# Patient Record
Sex: Male | Born: 1980 | Race: White | Hispanic: No | Marital: Married | Smoking: Current every day smoker
Health system: Southern US, Community
[De-identification: ages and names within clinical notes are randomized; demographics above are authoritative.]

## PROBLEM LIST (undated history)

## (undated) DIAGNOSIS — B1920 Unspecified viral hepatitis C without hepatic coma: Secondary | ICD-10-CM

## (undated) HISTORY — PX: ORTHOPEDIC SURGERY: SHX850

---

## 2014-02-09 ENCOUNTER — Encounter (HOSPITAL_COMMUNITY): Payer: Self-pay | Admitting: Emergency Medicine

## 2014-02-09 ENCOUNTER — Emergency Department (HOSPITAL_COMMUNITY)
Admission: EM | Admit: 2014-02-09 | Discharge: 2014-02-10 | Disposition: A | Discharge status: Home / Self Care | Payer: Self-pay | Attending: Emergency Medicine | Admitting: Emergency Medicine

## 2014-02-09 DIAGNOSIS — S5010XA Contusion of unspecified forearm, initial encounter: Secondary | ICD-10-CM | POA: Insufficient documentation

## 2014-02-09 DIAGNOSIS — F17200 Nicotine dependence, unspecified, uncomplicated: Secondary | ICD-10-CM | POA: Insufficient documentation

## 2014-02-09 DIAGNOSIS — Y9289 Other specified places as the place of occurrence of the external cause: Secondary | ICD-10-CM | POA: Insufficient documentation

## 2014-02-09 DIAGNOSIS — W230XXA Caught, crushed, jammed, or pinched between moving objects, initial encounter: Secondary | ICD-10-CM | POA: Insufficient documentation

## 2014-02-09 DIAGNOSIS — Y93E9 Activity, other interior property and clothing maintenance: Secondary | ICD-10-CM | POA: Insufficient documentation

## 2014-02-09 NOTE — ED Notes (Signed)
Pt states he was moving and got his right arm twisted up between some furniture and a wall,  Pt has swelling to right forearm and elbow area.

## 2014-02-10 ENCOUNTER — Emergency Department (HOSPITAL_COMMUNITY): Payer: Self-pay

## 2014-02-10 MED ORDER — HYDROCODONE-ACETAMINOPHEN 5-325 MG PO TABS: 1.0000 | ORAL_TABLET | ORAL | Status: DC | PRN

## 2014-02-10 MED ORDER — IBUPROFEN 600 MG PO TABS: 600.0000 mg | ORAL_TABLET | Freq: Four times a day (QID) | ORAL | Status: DC | PRN

## 2014-02-10 MED ORDER — HYDROCODONE-ACETAMINOPHEN 5-325 MG PO TABS
1.0000 | ORAL_TABLET | Freq: Once | ORAL | Status: AC
  Administered 2014-02-10: 1 via ORAL
  Filled 2014-02-10: qty 1

## 2014-02-10 MED ORDER — IBUPROFEN 800 MG PO TABS
800.0000 mg | ORAL_TABLET | Freq: Once | ORAL | Status: AC
  Administered 2014-02-10: 800 mg via ORAL
  Filled 2014-02-10: qty 1

## 2014-02-10 NOTE — Discharge Instructions (Signed)
Contusion °A contusion is a deep bruise. Contusions are the result of an injury that caused bleeding under the skin. The contusion may turn blue, purple, or yellow. Minor injuries will give you a painless contusion, but more severe contusions may stay painful and swollen for a few weeks.  °CAUSES  °A contusion is usually caused by a blow, trauma, or direct force to an area of the body. °SYMPTOMS  °· Swelling and redness of the injured area. °· Bruising of the injured area. °· Tenderness and soreness of the injured area. °· Pain. °DIAGNOSIS  °The diagnosis can be made by taking a history and physical exam. An X-ray, CT scan, or MRI may be needed to determine if there were any associated injuries, such as fractures. °TREATMENT  °Specific treatment will depend on what area of the body was injured. In general, the best treatment for a contusion is resting, icing, elevating, and applying cold compresses to the injured area. Over-the-counter medicines may also be recommended for pain control. Ask your caregiver what the best treatment is for your contusion. °HOME CARE INSTRUCTIONS  °· Put ice on the injured area. °· Put ice in a plastic bag. °· Place a towel between your skin and the bag. °· Leave the ice on for 15-20 minutes, 03-04 times a day. °· Only take over-the-counter or prescription medicines for pain, discomfort, or fever as directed by your caregiver. Your caregiver may recommend avoiding anti-inflammatory medicines (aspirin, ibuprofen, and naproxen) for 48 hours because these medicines may increase bruising. °· Rest the injured area. °· If possible, elevate the injured area to reduce swelling. °SEEK IMMEDIATE MEDICAL CARE IF:  °· You have increased bruising or swelling. °· You have pain that is getting worse. °· Your swelling or pain is not relieved with medicines. °MAKE SURE YOU:  °· Understand these instructions. °· Will watch your condition. °· Will get help right away if you are not doing well or get  worse. °Document Released: 08/26/2005 Document Revised: 02/08/2012 Document Reviewed: 09/21/2011 °[employer]® Patient Information ©2014 [employer]. ° ° °You may take the hydrocodone prescribed for pain relief.  This will make you drowsy - do not drive within 4 hours of taking this medication. ° ° °

## 2014-02-10 NOTE — ED Notes (Signed)
Patient given ice pack to place on arm. Having pain at this time. States pain in elbow radiating towards bicep of arm.

## 2014-02-12 NOTE — ED Provider Notes (Signed)
CSN: [id number]     Arrival date & time 02/09/14  2341 History   First MD Initiated Contact with Patient 02/09/14 2354     No chief complaint on file.    (Consider location/radiation/quality/duration/timing/severity/associated sxs/prior Treatment) HPI Comments: Craig Terry is a 33 y.o. Male presenting with increasing right proximal forearm and elbow pain since an injury which occurred yesterday.  He describes helping move a heavy dresser around a corner yesterday and his right forearm was crushed between the furniture and the wall.  He had immediate pain after the event which was worse when he woke today and now has developed swelling at the site.  He denies radiation of pain into his upper arm or his wrist and hand.  He denies numbness or tingling in his fingers.  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  He has taken no medicines or treatments for his pain which is sharp, constant and worse with movement of the elbow and palpation.     The history is provided by the patient.    History reviewed. No pertinent past medical history. History reviewed. No pertinent past surgical history. No family history on file. History  Substance Use Topics  . Smoking status: Current Every Day Smoker  . Smokeless tobacco: Not on file  . Alcohol Use: No    Review of Systems  Constitutional: Negative for fever.  Musculoskeletal: Positive for arthralgias and joint swelling. Negative for myalgias.  Neurological: Negative for weakness and numbness.      Allergies  Review of patient's allergies indicates no known allergies.  Home Medications   Current Outpatient Rx  Name  Route  Sig  Dispense  Refill  . HYDROcodone-acetaminophen (NORCO/VICODIN) 5-325 MG per tablet   Oral   Take 1 tablet by mouth every 4 (four) hours as needed.   20 tablet   0   . ibuprofen (ADVIL,MOTRIN) 600 MG tablet   Oral   Take 1 tablet (600 mg  total) by mouth every 6 (six) hours as needed.   30 tablet   0    BP 132/67  Pulse 100  Resp 16  SpO2 97% Physical Exam  Constitutional: He appears well-developed and well-nourished.  HENT:  Head: Atraumatic.  Neck: Normal range of motion.  Cardiovascular:  Pulses equal bilaterally  Musculoskeletal: He exhibits tenderness.  ttp with edema and mild erythema right proximal lateral forearm.  Skin is intact and soft.  Pt is mildly uncomfortable with elbow and wrist flexion/extension,  Pronation and supination with FROM.  Radial pulse intact,  Equal grip strength.  No numbness distal to the injury site.  No ecchymosis.  Neurological: He is alert. He has normal strength. He displays normal reflexes. No sensory deficit.  Skin: Skin is warm and dry.  Psychiatric: He has a normal mood and affect.    ED Course  Procedures (including critical care time) Labs Review Labs Reviewed - No data to display Imaging Review No results found.   EKG Interpretation None      MDM   Final diagnoses:  Contusion, forearm    Patients labs and/or radiological studies were viewed and considered during the medical decision making and disposition process.  Pt with contusion secondary to crush injury to forearm.  He has no signs of compartment syndrome at this time.  He was instructed on sx which would prompt immediate return including worse pain or swelling, numbness in wrist/hand/fingers.  Pt understands plan.  He was encouraged ice/elevation.  Ibuprofen, hydrcodone prescribed. referral to Dr Romeo Apple if not improving over the next week,  Return here immediately for any worsened sx.     Burgess Amor, PA-C 02/12/14 1344

## 2014-02-15 NOTE — ED Provider Notes (Signed)
Medical screening examination/treatment/procedure(s) were performed by non-physician practitioner and as supervising physician I was immediately available for consultation/collaboration.   EKG Interpretation None       Derwood KaplanAnkit Allister Lessley, MD 02/15/14 [phone]

## 2014-03-21 ENCOUNTER — Encounter (HOSPITAL_COMMUNITY): Payer: Self-pay | Admitting: Emergency Medicine

## 2014-03-21 ENCOUNTER — Emergency Department (HOSPITAL_COMMUNITY)
Admission: EM | Admit: 2014-03-21 | Discharge: 2014-03-22 | Disposition: A | Discharge status: Home / Self Care | Payer: Self-pay | Attending: Emergency Medicine | Admitting: Emergency Medicine

## 2014-03-21 DIAGNOSIS — M545 Low back pain, unspecified: Secondary | ICD-10-CM | POA: Insufficient documentation

## 2014-03-21 DIAGNOSIS — F17200 Nicotine dependence, unspecified, uncomplicated: Secondary | ICD-10-CM | POA: Insufficient documentation

## 2014-03-21 DIAGNOSIS — R5383 Other fatigue: Secondary | ICD-10-CM

## 2014-03-21 DIAGNOSIS — R6883 Chills (without fever): Secondary | ICD-10-CM | POA: Insufficient documentation

## 2014-03-21 DIAGNOSIS — M2550 Pain in unspecified joint: Secondary | ICD-10-CM

## 2014-03-21 DIAGNOSIS — Z8619 Personal history of other infectious and parasitic diseases: Secondary | ICD-10-CM | POA: Insufficient documentation

## 2014-03-21 DIAGNOSIS — M25569 Pain in unspecified knee: Secondary | ICD-10-CM | POA: Insufficient documentation

## 2014-03-21 DIAGNOSIS — R5381 Other malaise: Secondary | ICD-10-CM | POA: Insufficient documentation

## 2014-03-21 DIAGNOSIS — R638 Other symptoms and signs concerning food and fluid intake: Secondary | ICD-10-CM | POA: Insufficient documentation

## 2014-03-21 HISTORY — DX: Unspecified viral hepatitis C without hepatic coma: B19.20

## 2014-03-21 NOTE — ED Notes (Signed)
Pain in lower back for several days, also reports generalized aches and pain in knees.  Pt also states his bm's have been gray in color.

## 2014-03-22 LAB — URINALYSIS, ROUTINE W REFLEX MICROSCOPIC
BILIRUBIN URINE: NEGATIVE
Glucose, UA: NEGATIVE mg/dL
Hgb urine dipstick: NEGATIVE
KETONES UR: NEGATIVE mg/dL
Leukocytes, UA: NEGATIVE
NITRITE: NEGATIVE
Protein, ur: NEGATIVE mg/dL
Specific Gravity, Urine: <1.005 — ABNORMAL LOW (ref 1.005–1.030)
Urobilinogen, UA: 0.2 mg/dL (ref 0.0–1.0)
pH: 6 (ref 5.0–8.0)

## 2014-03-22 LAB — COMPREHENSIVE METABOLIC PANEL
ALK PHOS: 59 U/L (ref 39–117)
ALT: 32 U/L (ref 0–53)
AST: 30 U/L (ref 0–37)
Albumin: 3.9 g/dL (ref 3.5–5.2)
BILIRUBIN TOTAL: 0.3 mg/dL (ref 0.3–1.2)
BUN: 11 mg/dL (ref 6–23)
CHLORIDE: 104 meq/L (ref 96–112)
CO2: 24 mEq/L (ref 19–32)
Calcium: 9 mg/dL (ref 8.4–10.5)
Creatinine, Ser: 1.13 mg/dL (ref 0.50–1.35)
GFR calc non Af Amer: 84 mL/min — ABNORMAL LOW (ref >90)
GLUCOSE: 110 mg/dL — AB (ref 70–99)
POTASSIUM: 3.6 meq/L — AB (ref 3.7–5.3)
Sodium: 139 mEq/L (ref 137–147)
Total Protein: 6.7 g/dL (ref 6.0–8.3)

## 2014-03-22 LAB — CBC WITH DIFFERENTIAL/PLATELET
Basophils Absolute: 0 10*3/uL (ref 0.0–0.1)
Basophils Relative: 0 % (ref 0–1)
EOS ABS: 0 10*3/uL (ref 0.0–0.7)
Eosinophils Relative: 1 % (ref 0–5)
HCT: 38.7 % — ABNORMAL LOW (ref 39.0–52.0)
Hemoglobin: 13.7 g/dL (ref 13.0–17.0)
LYMPHS ABS: 1 10*3/uL (ref 0.7–4.0)
LYMPHS PCT: 18 % (ref 12–46)
MCH: 31.1 pg (ref 26.0–34.0)
MCHC: 35.4 g/dL (ref 30.0–36.0)
MCV: 87.8 fL (ref 78.0–100.0)
Monocytes Absolute: 0.5 10*3/uL (ref 0.1–1.0)
Monocytes Relative: 9 % (ref 3–12)
NEUTROS PCT: 72 % (ref 43–77)
Neutro Abs: 4.2 10*3/uL (ref 1.7–7.7)
Platelets: 212 10*3/uL (ref 150–400)
RBC: 4.41 MIL/uL (ref 4.22–5.81)
RDW: 12.3 % (ref 11.5–15.5)
WBC: 5.8 10*3/uL (ref 4.0–10.5)

## 2014-03-22 MED ORDER — CYCLOBENZAPRINE HCL 10 MG PO TABS: 10.0000 mg | ORAL_TABLET | Freq: Three times a day (TID) | ORAL | Status: DC | PRN

## 2014-03-22 MED ORDER — NAPROXEN 500 MG PO TABS: 500.0000 mg | ORAL_TABLET | Freq: Two times a day (BID) | ORAL | Status: DC

## 2014-03-22 MED ORDER — OXYCODONE HCL 5 MG PO TABS
5.0000 mg | ORAL_TABLET | Freq: Once | ORAL | Status: AC
  Administered 2014-03-22: 5 mg via ORAL
  Filled 2014-03-22: qty 1

## 2014-03-22 MED ORDER — OXYCODONE HCL 5 MG PO TABS: 5.0000 mg | ORAL_TABLET | ORAL | Status: DC | PRN

## 2014-03-22 NOTE — Discharge Instructions (Signed)
Back Pain, Adult Low back pain is very common. About 1 in 5 people have back pain.The cause of low back pain is rarely dangerous. The pain often gets better over time.About half of people with a sudden onset of back pain feel better in just 2 weeks. About 8 in 10 people feel better by 6 weeks.  CAUSES Some common causes of back pain include:  Strain of the muscles or ligaments supporting the spine.  Wear and tear (degeneration) of the spinal discs.  Arthritis.  Direct injury to the back. DIAGNOSIS Most of the time, the direct cause of low back pain is not known.However, back pain can be treated effectively even when the exact cause of the pain is unknown.Answering your caregiver's questions about your overall health and symptoms is one of the most accurate ways to make sure the cause of your pain is not dangerous. If your caregiver needs more information, he or she may order lab work or imaging tests (X-rays or MRIs).However, even if imaging tests show changes in your back, this usually does not require surgery. HOME CARE INSTRUCTIONS For many people, back pain returns.Since low back pain is rarely dangerous, it is often a condition that people can learn to [facility] their own.   Remain active. It is stressful on the back to sit or stand in one place. Do not sit, drive, or stand in one place for more than 30 minutes at a time. Take short walks on level surfaces as soon as pain allows.Try to increase the length of time you walk each day.  Do not stay in bed.Resting more than 1 or 2 days can delay your recovery.  Do not avoid exercise or work.Your body is made to move.It is not dangerous to be active, even though your back may hurt.Your back will likely heal faster if you return to being active before your pain is gone.  Pay attention to your body when you bend and lift. Many people have less discomfortwhen lifting if they bend their knees, keep the load close to their bodies,and  avoid twisting. Often, the most comfortable positions are those that put less stress on your recovering back.  Find a comfortable position to sleep. Use a firm mattress and lie on your side with your knees slightly bent. If you lie on your back, put a pillow under your knees.  Only take over-the-counter or prescription medicines as directed by your caregiver. Over-the-counter medicines to reduce pain and inflammation are often the most helpful.Your caregiver may prescribe muscle relaxant drugs.These medicines help dull your pain so you can more quickly return to your normal activities and healthy exercise.  Put ice on the injured area.  Put ice in a plastic bag.  Place a towel between your skin and the bag.  Leave the ice on for 15-20 minutes, 03-04 times a day for the first 2 to 3 days. After that, ice and heat may be alternated to reduce pain and spasms.  Ask your caregiver about trying back exercises and gentle massage. This may be of some benefit.  Avoid feeling anxious or stressed.Stress increases muscle tension and can worsen back pain.It is important to recognize when you are anxious or stressed and learn ways to manage it.Exercise is a great option. SEEK MEDICAL CARE IF:  You have pain that is not relieved with rest or medicine.  You have pain that does not improve in 1 week.  You have new symptoms.  You are generally not feeling well. SEEK  IMMEDIATE MEDICAL CARE IF:   You have pain that radiates from your back into your legs.  You develop new bowel or bladder control problems.  You have unusual weakness or numbness in your arms or legs.  You develop nausea or vomiting.  You develop abdominal pain.  You feel faint. Document Released: 11/16/2005 Document Revised: 05/17/2012 Document Reviewed: 04/06/2011 [facility] Patient Information 2014 [place], [place].  Arthralgia Your caregiver has diagnosed you as suffering from an arthralgia. Arthralgia means there is pain  in a joint. This can come from many reasons including:  Bruising the joint which causes soreness (inflammation) in the joint.  Wear and tear on the joints which occur as we grow older (osteoarthritis).  Overusing the joint.  Various forms of arthritis.  Infections of the joint. Regardless of the cause of pain in your joint, most of these different pains respond to anti-inflammatory drugs and rest. The exception to this is when a joint is infected, and these cases are treated with antibiotics, if it is a bacterial infection. HOME CARE INSTRUCTIONS   Rest the injured area for as long as directed by your caregiver. Then slowly start using the joint as directed by your caregiver and as the pain allows. Crutches as directed may be useful if the ankles, knees or hips are involved. If the knee was splinted or casted, continue use and care as directed. If an stretchy or elastic wrapping bandage has been applied today, it should be removed and re-applied every 3 to 4 hours. It should not be applied tightly, but firmly enough to keep swelling down. Watch toes and feet for swelling, bluish discoloration, coldness, numbness or excessive pain. If any of these problems (symptoms) occur, remove the ace bandage and re-apply more loosely. If these symptoms persist, contact your caregiver or return to this location.  For the first 24 hours, keep the injured extremity elevated on pillows while lying down.  Apply ice for 15-20 minutes to the sore joint every couple hours while awake for the first half day. Then 03-04 times per day for the first 48 hours. Put the ice in a plastic bag and place a towel between the bag of ice and your skin.  Wear any splinting, casting, elastic bandage applications, or slings as instructed.  Only take over-the-counter or prescription medicines for pain, discomfort, or fever as directed by your caregiver. Do not use aspirin immediately after the injury unless instructed by your  physician. Aspirin can cause increased bleeding and bruising of the tissues.  If you were given crutches, continue to use them as instructed and do not resume weight bearing on the sore joint until instructed. Persistent pain and inability to use the sore joint as directed for more than 2 to 3 days are warning signs indicating that you should see a caregiver for a follow-up visit as soon as possible. Initially, a hairline fracture (break in bone) may not be evident on X-rays. Persistent pain and swelling indicate that further evaluation, non-weight bearing or use of the joint (use of crutches or slings as instructed), or further X-rays are indicated. X-rays may sometimes not show a small fracture until a week or 10 days later. Make a follow-up appointment with your own caregiver or one to whom we have referred you. A radiologist (specialist in reading X-rays) may read your X-rays. Make sure you know how you are to obtain your X-ray results. Do not assume everything is normal if you do not hear from [place]. SEEK MEDICAL CARE  IF: Bruising, swelling, or pain increases. SEEK IMMEDIATE MEDICAL CARE IF:   Your fingers or toes are numb or blue.  The pain is not responding to medications and continues to stay the same or get worse.  The pain in your joint becomes severe.  You develop a fever over 102 F (38.9 C).  It becomes impossible to move or use the joint. MAKE SURE YOU:   Understand these instructions.  Will watch your condition.  Will get help right away if you are not doing well or get worse. Document Released: 11/16/2005 Document Revised: 02/08/2012 Document Reviewed: 07/04/2008 [facility] Patient Information 2014 [place], [place].  Oxycodone tablets or capsules What is this medicine? OXYCODONE (ox i KOE done) is a pain reliever. It is used to treat moderate to severe pain. This medicine may be used for other purposes; ask your health care provider or pharmacist if you have  questions. COMMON BRAND NAME(S): Dazidox , Endocodone , OXECTA, OxyIR, Percolone, Roxicodone What should I tell my health care provider before I take this medicine? They need to know if you have any of these conditions: -Addison's disease -brain tumor -drug abuse or addiction -head injury -heart disease -if you frequently drink alcohol containing drinks -kidney disease or problems going to the bathroom -liver disease -lung disease, asthma, or breathing problems -mental problems -an unusual or allergic reaction to oxycodone, codeine, hydrocodone, morphine, other medicines, foods, dyes, or preservatives -pregnant or trying to get pregnant -breast-feeding How should I use this medicine? Take this medicine by mouth with a glass of water. Follow the directions on the prescription label. You can take it with or without food. If it upsets your stomach, take it with food. Take your medicine at regular intervals. Do not take it more often than directed. Do not stop taking except on your doctor's advice. Some brands of this medicine, like Oxecta, have special instructions. Ask your doctor or pharmacist if these directions are for you: Do not cut, crush or chew this medicine. Swallow only one tablet at a time. Do not wet, soak, or lick the tablet before you take it. Talk to your pediatrician regarding the use of this medicine in children. Special care may be needed. Overdosage: If you think you have taken too much of this medicine contact a poison control center or emergency room at once. NOTE: This medicine is only for you. Do not share this medicine with others. What if I miss a dose? If you miss a dose, take it as soon as you can. If it is almost time for your next dose, take only that dose. Do not take double or extra doses. What may interact with this medicine? -alcohol -antihistamines -certain medicines used for nausea like chlorpromazine, droperidol -erythromycin -ketoconazole -medicines  for depression, anxiety, or psychotic disturbances -medicines for sleep -muscle relaxants -naloxone -naltrexone -narcotic medicines (opiates) for pain -nilotinib -phenobarbital -phenytoin -rifampin -ritonavir -voriconazole This list may not describe all possible interactions. Give your health care provider a list of all the medicines, herbs, non-prescription drugs, or dietary supplements you use. Also tell them if you smoke, drink alcohol, or use illegal drugs. Some items may interact with your medicine. What should I watch for while using this medicine? Tell your doctor or health care professional if your pain does not go away, if it gets worse, or if you have new or a different type of pain. You may develop tolerance to the medicine. Tolerance means that you will need a higher dose of the medicine  for pain relief. Tolerance is normal and is expected if you take this medicine for a long time. Do not suddenly stop taking your medicine because you may develop a severe reaction. Your body becomes used to the medicine. This does NOT mean you are addicted. Addiction is a behavior related to getting and using a drug for a non-medical reason. If you have pain, you have a medical reason to take pain medicine. Your doctor will tell you how much medicine to take. If your doctor wants you to stop the medicine, the dose will be slowly lowered over time to avoid any side effects. You may get drowsy or dizzy when you first start taking this medicine or change doses. Do not drive, use machinery, or do anything that may be dangerous until you know how the medicine affects you. Stand or sit up slowly. There are different types of narcotic medicines (opiates) for pain. If you take more than one type at the same time, you may have more side effects. Give your health care provider a list of all medicines you use. Your doctor will tell you how much medicine to take. Do not take more medicine than directed. Call  emergency for help if you have problems breathing. This medicine will cause constipation. Try to have a bowel movement at least every 2 to 3 days. If you do not have a bowel movement for 3 days, call your doctor or health care professional. Your mouth may get dry. Drinking water, chewing sugarless gum, or sucking on hard candy may help. See your dentist every 6 months. What side effects may I notice from receiving this medicine? Side effects that you should report to your doctor or health care professional as soon as possible: -allergic reactions like skin rash, itching or hives, swelling of the face, lips, or tongue -breathing problems -confusion -feeling faint or lightheaded, falls -trouble passing urine or change in the amount of urine -unusually weak or tired Side effects that usually do not require medical attention (report to your doctor or health care professional if they continue or are bothersome): -constipation -dry mouth -itching -nausea, vomiting -upset stomach This list may not describe all possible side effects. Call your doctor for medical advice about side effects. You may report side effects to FDA at 1-800-FDA-1088. Where should I keep my medicine? Keep out of the reach of children. This medicine can be abused. Keep your medicine in a safe place to protect it from theft. Do not share this medicine with anyone. Selling or giving away this medicine is dangerous and against the law. Store at room temperature between 15 and 30 degrees C (59 and 86 degrees F). Protect from light. Keep container tightly closed. This medicine may cause accidental overdose and death if it is taken by other adults, children, or pets. Flush any unused medicine down the toilet to reduce the chance of harm. Do not use the medicine after the expiration date. NOTE: This sheet is a summary. It may not cover all possible information. If you have questions about this medicine, talk to your doctor, pharmacist,  or health care provider.  2014, Elsevier/Gold Standard. (2013-07-27 13:43:33)  Naproxen and naproxen sodium oral immediate-release tablets What is this medicine? NAPROXEN (na PROX en) is a non-steroidal anti-inflammatory drug (NSAID). It is used to reduce swelling and to treat pain. This medicine may be used for dental pain, headache, or painful monthly periods. It is also used for painful joint and muscular problems such as arthritis, tendinitis,  bursitis, and gout. This medicine may be used for other purposes; ask your health care provider or pharmacist if you have questions. COMMON BRAND NAME(S): Aflaxen, Aleve Arthritis, Aleve, All Day Relief, Anaprox DS, Anaprox, Naprosyn What should I tell my health care provider before I take this medicine? They need to know if you have any of these conditions: -asthma -cigarette smoker -drink more than 3 alcohol containing drinks a day -heart disease or circulation problems such as heart failure or leg edema (fluid retention) -high blood pressure -kidney disease -liver disease -stomach bleeding or ulcers -an unusual or allergic reaction to naproxen, aspirin, other NSAIDs, other medicines, foods, dyes, or preservatives -pregnant or trying to get pregnant -breast-feeding How should I use this medicine? Take this medicine by mouth with a glass of water. Follow the directions on the prescription label. Take it with food if your stomach gets upset. Try to not lie down for at least 10 minutes after you take it. Take your medicine at regular intervals. Do not take your medicine more often than directed. Long-term, continuous use may increase the risk of heart attack or stroke. A special MedGuide will be given to you by the pharmacist with each prescription and refill. Be sure to read this information carefully each time. Talk to your pediatrician regarding the use of this medicine in children. Special care may be needed. Overdosage: If you think you have  taken too much of this medicine contact a poison control center or emergency room at once. NOTE: This medicine is only for you. Do not share this medicine with others. What if I miss a dose? If you miss a dose, take it as soon as you can. If it is almost time for your next dose, take only that dose. Do not take double or extra doses. What may interact with this medicine? -alcohol -aspirin -cidofovir -diuretics -lithium -methotrexate -other drugs for inflammation like ketorolac or prednisone -pemetrexed -probenecid -warfarin This list may not describe all possible interactions. Give your health care provider a list of all the medicines, herbs, non-prescription drugs, or dietary supplements you use. Also tell them if you smoke, drink alcohol, or use illegal drugs. Some items may interact with your medicine. What should I watch for while using this medicine? Tell your doctor or health care professional if your pain does not get better. Talk to your doctor before taking another medicine for pain. Do not treat yourself. This medicine does not prevent heart attack or stroke. In fact, this medicine may increase the chance of a heart attack or stroke. The chance may increase with longer use of this medicine and in people who have heart disease. If you take aspirin to prevent heart attack or stroke, talk with your doctor or health care professional. Do not take other medicines that contain aspirin, ibuprofen, or naproxen with this medicine. Side effects such as stomach upset, nausea, or ulcers may be more likely to occur. Many medicines available without a prescription should not be taken with this medicine. This medicine can cause ulcers and bleeding in the stomach and intestines at any time during treatment. Do not smoke cigarettes or drink alcohol. These increase irritation to your stomach and can make it more susceptible to damage from this medicine. Ulcers and bleeding can happen without warning  symptoms and can cause death. You may get drowsy or dizzy. Do not drive, use machinery, or do anything that needs mental alertness until you know how this medicine affects you. Do not stand or  sit up quickly, especially if you are an older patient. This reduces the risk of dizzy or fainting spells. This medicine can cause you to bleed more easily. Try to avoid damage to your teeth and gums when you brush or floss your teeth. What side effects may I notice from receiving this medicine? Side effects that you should report to your doctor or health care professional as soon as possible: -black or bloody stools, blood in the urine or vomit -blurred vision -chest pain -difficulty breathing or wheezing -nausea or vomiting -severe stomach pain -skin rash, skin redness, blistering or peeling skin, hives, or itching -slurred speech or weakness on one side of the body -swelling of eyelids, throat, lips -unexplained weight gain or swelling -unusually weak or tired -yellowing of eyes or skin Side effects that usually do not require medical attention (report to your doctor or health care professional if they continue or are bothersome): -constipation -headache -heartburn This list may not describe all possible side effects. Call your doctor for medical advice about side effects. You may report side effects to FDA at 1-800-FDA-1088. Where should I keep my medicine? Keep out of the reach of children. Store at room temperature between 15 and 30 degrees C (59 and 86 degrees F). Keep container tightly closed. Throw away any unused medicine after the expiration date. NOTE: This sheet is a summary. It may not cover all possible information. If you have questions about this medicine, talk to your doctor, pharmacist, or health care provider.  2014, Elsevier/Gold Standard. (2009-11-18 20:10:16)  Cyclobenzaprine tablets What is this medicine? CYCLOBENZAPRINE (sye kloe BEN za preen) is a muscle relaxer. It is  used to treat muscle pain, spasms, and stiffness. This medicine may be used for other purposes; ask your health care provider or pharmacist if you have questions. COMMON BRAND NAME(S): Fexmid, Flexeril What should I tell my health care provider before I take this medicine? They need to know if you have any of these conditions: -heart disease, irregular heartbeat, or previous heart attack -liver disease -thyroid problem -an unusual or allergic reaction to cyclobenzaprine, tricyclic antidepressants, lactose, other medicines, foods, dyes, or preservatives -pregnant or trying to get pregnant -breast-feeding How should I use this medicine? Take this medicine by mouth with a glass of water. Follow the directions on the prescription label. If this medicine upsets your stomach, take it with food or milk. Take your medicine at regular intervals. Do not take it more often than directed. Talk to your pediatrician regarding the use of this medicine in children. Special care may be needed. Overdosage: If you think you have taken too much of this medicine contact a poison control center or emergency room at once. NOTE: This medicine is only for you. Do not share this medicine with others. What if I miss a dose? If you miss a dose, take it as soon as you can. If it is almost time for your next dose, take only that dose. Do not take double or extra doses. What may interact with this medicine? Do not take this medicine with any of the following medications: -certain medicines for fungal infections like fluconazole, itraconazole, ketoconazole, posaconazole, voriconazole -cisapride -dofetilide -dronedarone -droperidol -flecainide -grepafloxacin -halofantrine -levomethadyl -MAOIs like Carbex, Eldepryl, Marplan, Nardil, and Parnate -nilotinib -pimozide -probucol -sertindole -thioridazine -ziprasidone  This medicine may also interact with the following medications: -abarelix -alcohol -certain  medicines for cancer -certain medicines for depression, anxiety, or psychotic disturbances -certain medicines for infection like alfuzosin, chloroquine, clarithromycin, levofloxacin, mefloquine,  pentamidine, troleandomycin -certain medicines for an irregular heart beat -certain medicines used for sleep or numbness during surgery or procedure -contrast dyes -dolasetron -guanethidine -methadone -octreotide -ondansetron -other medicines that prolong the QT interval (cause an abnormal heart rhythm) -palonosetron -phenothiazines like chlorpromazine, mesoridazine, prochlorperazine, thioridazine -tramadol -vardenafil This list may not describe all possible interactions. Give your health care provider a list of all the medicines, herbs, non-prescription drugs, or dietary supplements you use. Also tell them if you smoke, drink alcohol, or use illegal drugs. Some items may interact with your medicine. What should I watch for while using this medicine? Check with your doctor or health care professional if your condition does not improve within 1 to 3 weeks. You may get drowsy or dizzy when you first start taking the medicine or change doses. Do not drive, use machinery, or do anything that may be dangerous until you know how the medicine affects you. Stand or sit up slowly. Your mouth may get dry. Drinking water, chewing sugarless gum, or sucking on hard candy may help. What side effects may I notice from receiving this medicine? Side effects that you should report to your doctor or health care professional as soon as possible: -allergic reactions like skin rash, itching or hives, swelling of the face, lips, or tongue -chest pain -fast heartbeat -hallucinations -seizures -vomiting Side effects that usually do not require medical attention (report to your doctor or health care professional if they continue or are bothersome): -headache This list may not describe all possible side effects. Call  your doctor for medical advice about side effects. You may report side effects to FDA at 1-800-FDA-1088. Where should I keep my medicine? Keep out of the reach of children. Store at room temperature between 15 and 30 degrees C (59 and 86 degrees F). Keep container tightly closed. Throw away any unused medicine after the expiration date. NOTE: This sheet is a summary. It may not cover all possible information. If you have questions about this medicine, talk to your doctor, pharmacist, or health care provider.  2014, [employer] Standard. (2013-06-13 12:48:19)   Emergency Department Resource Guide 1) Find a Doctor and Pay Out of Pocket Although you won't have to find out who is covered by your insurance plan, it is a good idea to ask around and get recommendations. You will then need to call the office and see if the doctor you have chosen will accept you as a new patient and what types of options they offer for patients who are self-pay. Some doctors offer discounts or will set up payment plans for their patients who do not have insurance, but you will need to ask so you aren't surprised when you get to your appointment.  2) Contact Your Local Health Department Not all health departments have doctors that can see patients for sick visits, but many do, so it is worth a call to see if yours does. If you don't know where your local health department is, you can check in your phone book. The CDC also has a tool to help you locate your state's health department, and many state websites also have listings of all of their local health departments.  3) Find a Walk-in Clinic If your illness is not likely to be very severe or complicated, you may want to try a walk in clinic. These are popping up all over the country in pharmacies, drugstores, and shopping centers. They're usually staffed by nurse practitioners or physician assistants that have been trained to  treat common illnesses and complaints. They're  usually fairly quick and inexpensive. However, if you have serious medical issues or chronic medical problems, these are probably not your best option.  No Primary Care Doctor: - Call [facility] Connect at  [phone] - they can help you locate a primary care doctor that  accepts your insurance, provides certain services, etc. - Physician Referral Service- [phone]  Chronic Pain Problems: Organization         Address  Phone   Notes  [facility]  [phone] Patients need to be referred by their primary care doctor.   Medication Assistance: Organization         Address  Phone   Notes  [facility] Medication [facility] [address] [place] [place], [place] [address] [phone] --Must be a resident of [facility] -- Must have NO insurance coverage whatsoever (no Medicaid/ Medicare, etc.) -- The pt. MUST have a primary care doctor that directs their care regularly and follows them in the community   [facility]  [phone]   [employer]  [phone]    Agencies that provide inexpensive medical care: Organization         Address  Phone   Notes  [facility]  [phone]   [facility]    [phone]   [facility] [address] [place], [place] [address] [phone]   [facility] [place] [address] [place] [place] [phone]   Planned Parenthood    [phone]   [facility]    [phone]   [facility]  [address], [place] Phone:  [phone], Fax:  [phone] Hours of Operation:  9 am - 6 pm, M-F.  Also accepts Medicaid/Medicare and self-pay.  [facility]  [address], [place] Phone: [phone], Fax: [phone]. Hours of Operation:  8:30 am - 5:30 pm, M-F.  Also accepts Medicaid and self-pay.  [facility] [place] [address], [place] Phone:  [phone]   [facility] [address] Natasha Bence [place], [place] [phone], Ext. [phone] Mondays & Thursdays: 7-9 AM.  First 15 patients are seen on a first come, first serve basis.    Medicaid-accepting [facility] Providers:  Organization         Address  Phone   Notes  [facility] [address],  [phone] Also accepts self-pay patients.  [facility] [address] Laurell Josephs [place], [place]  [phone]   [facility] [address], [place] [phone]   [facility] [address], [place] [phone]   Renaye Rakers [address], [place]   [phone] Only accepts [place] Access [place] patients after they have their name applied to their card.   Self-Pay (no insurance) in [facility]:  Organization         Address  Phone   Notes  Sickle Cell Patients, [facility] [address] [place], [place] [phone]   [facility] [address] [place], [place] [phone]   [facility] [place]  [address], [place] [phone]   [facility]/Dr. Osei-Bonsu  [address], [place] or [address], [place] [phone] Phone number for both [facility] and [place] locations is the same.  [facility] and [facility] [address], [place] [phone]   [facility] [address], [place] or [address] [phone]   [facility] [address], [place] [phone], phone; [phone], fax Sees patients 1st and 3rd Saturday of every month.  Must not qualify for public or private insurance (i.e. Medicaid, Medicare, [place] Health Choice, Veterans' Benefits)  Household income should be no more than 200% of the poverty level The clinic cannot treat  you if you are pregnant or think you are pregnant  Sexually transmitted diseases are not treated at the clinic.    Dental Care: Organization         Address  Phone  Notes  [facility] [address] [place], [place] [phone] Accepts children up to age 73 who are enrolled in [place] or [employer] Health Choice; pregnant women with a Medicaid card; and children who have applied for Medicaid or [place] Health Choice, but were declined, whose parents can pay a reduced fee at time of service.  [facility] of [facility]  [address], [place] [phone] Accepts children up to age 41 who are enrolled in [place] or [place] Health Choice; pregnant women with a Medicaid card; and children who have applied for Medicaid or [place] Health Choice, but were declined, whose parents can pay a reduced fee at time of service.  [facility]  [address] [place], [place] [phone] Patients are seen by appointment only. Walk-ins are not accepted. [facility] will see patients 38 years of age and older. Monday - Tuesday (8am-5pm) Most Wednesdays (8:30-5pm) $30 per visit, cash only  [facility]  [address], [facility] [phone] Patients are seen by appointment only. Walk-ins are not accepted. [facility] will see patients 42 years of age and older. One Wednesday Evening (Monthly: Volunteer Based).  $30 per visit, cash only  [employer]  [phone] for adults; Children under age 34, call Graduate Pediatric Dentistry at [phone]. Children aged 67-14, please call [phone] to request a pediatric application.  Dental services are provided in all areas of dental care including fillings, crowns and bridges, complete and partial dentures, implants, gum treatment, root canals, and extractions. Preventive care is also provided. Treatment  is provided to both adults and children. Patients are selected via a lottery and there is often a waiting list.   [facility] [address] [place]  [phone] [email or web address]   [facility] [address] [place], [place] [phone], Ext. [phone] Second and Fourth Thursday of each month, opens at 6:30 AM; Clinic ends at 9 AM.  Patients are seen on a first-come first-served basis, and a limited number are seen during each clinic.   [facility]  [address] Ether [place], [place] [phone]   Eligibility Requirements You must have lived in [place], [place], or [place] counties for at least the last three months.   You cannot be eligible for state or federal sponsored [place], including [employer], [place], or [employer].   You generally cannot be eligible for healthcare insurance through your employer.    How to apply: Eligibility screenings are held every  Tuesday and Wednesday afternoon from 1:00 pm until 4:00 pm. You do not need an appointment for the interview!  [facility] [address] [place], [place] [phone]   [facility]  [phone]   [facility]  [phone]   [facility] Health Department  [phone]    Behavioral Health Resources in the Community: Intensive Outpatient Programs Organization         Address  Phone  Notes  [facility] [address] [place], [place] [phone]   [facility] [address], [place], [place] [phone]   [facility] [address], [place], [place]  [phone]   [facility] [address],  [place], [place] [phone] or [phone]   Substance Abuse Resources Organization         Address  Phone  Notes  [facility]  [phone]   [facility]  [phone]   [facility]  [place]  [phone]   Floydene Flock  [phone]   Residential & Outpatient Substance Abuse Program  [phone]   Psychological Services Organization         Address  Phone  Notes  [facility]  [phone]   [facility]  [phone]   [facility] [address], [place] [phone] or [phone]    Mobile Crisis Teams Organization         Address  Phone  Notes  [facility], Mobile Crisis Care Unit  [phone]   [facility]  [address] [place], [place] [phone]   Doristine Locks [address] [place] [phone]    Self-Help/Support Groups Organization         Address  Phone             Notes  [facility] [place] - variety of support groups  336- I7437963 Call for more information  Narcotics Anonymous (NA), [facility] [address], [employer] [place]  2 meetings at this location   Statistician         Address  Phone  Notes  [facility] [address],    [place]  [phone]   [facility]  [address] [place] [address], [place], [place] [phone]   [facility] [address] [place], [place] [phone] Admissions: 8am-3pm M-F  [facility] [address]    [place], [place] [phone]   [facility] [address] [place], [place], [place] [phone]   [facility] [address],  [place], [place] [phone]   [facility] - Intensive Outpatient [address] Laurell Joseph[address], [place], [place] [phone]   [facility] ([facility] [address] [place]  [place], [place] [phone] or [phone]   [facility] ([facility]) [address] [place], [place] [phone] Accepts Medicaid  [facility] [address]  [place] [phone] Substance Abuse/Addiction Treatment    [facility] Organization         Address  Phone  Notes  [facility]  [phone]   Angie Fava, PhD [address] Ervin Knack [place], [place]   [phone] or [phone]   [facility]   [address] [place], [place] [phone]   [facility] [address] [place], [place] [phone] Insurance/Medicaid/sponsorship through [facility]  [facility] [address] [place], [place] [phone] [place], [place] [phone]    Dr. Adele Schilder  [phone]   [facility] Dept. 1) [address], [place] 2) [place] 3)  [place] [address], [place] [phone]  [phone]   [place] [phone] or [phone] (After Hours)

## 2014-03-22 NOTE — ED Provider Notes (Signed)
CSN: [id number]     Arrival date & time 03/21/14  2324 History  This chart was scribed for Dione Boozeavid Angelize Ryce, MD by Bennett Scrapehristina Taylor, ED Scribe. This patient was seen in room APA01/APA01 and the patient's care was started at 12:04 AM.    Chief Complaint  Patient presents with  . Back Pain  . Hepatitis C     The history is provided by the patient. No language interpreter was used.   HPI Comments: Craig Terry is a 33 y.o. male with a h/o Hep C diagnosed in December 2014 who presents to the Emergency Department complaining of 3 days of constant lower back pain with associated yellow and malodorous stool, chills, decreased appetite, fatigue and bilateral knee pain that he attributes to a flare of his Hep C symptoms. He states his legs and back hurt more when ambulating and rates the severity of his pain is a 7 out of 10 currently. He denies trying any OTC medications for the pain or having pain pill prescriptions at home. Pt denies having a fever, nausea, emesis, and diarrhea as associated symptoms. He states that he is a 1ppd smoker and occasionaly drinks 22oz of beer on his way home from work about twice a week.  No PCP or infectious disease specialist Pt reports that [place] moved here from [place] in Jan 2015 and is awaiting his insurance to be approved being seeking primary care   Past Medical History  Diagnosis Date  . Hepatitis C    Past Surgical History  Procedure Laterality Date  . Orthopedic surgery     No family history on file. History  Substance Use Topics  . Smoking status: Current Every Day Smoker  . Smokeless tobacco: Not on file  . Alcohol Use: No    Review of Systems  Constitutional: Positive for chills, appetite change and fatigue. Negative for fever.  Gastrointestinal: Negative for nausea, vomiting and diarrhea.  Musculoskeletal: Positive for arthralgias and back pain.  All other systems reviewed and are negative.    Allergies  Review of patient's allergies indicates no  known allergies.  Home Medications   Prior to Admission medications   Medication Sig Start Date End Date Taking? Authorizing Provider  diphenhydrAMINE (SOMINEX) 25 MG tablet Take 25 mg by mouth at bedtime as needed for sleep.   Yes Historical Provider, MD  HYDROcodone-acetaminophen (NORCO/VICODIN) 5-325 MG per tablet Take 1 tablet by mouth every 4 (four) hours as needed. 02/10/14   Burgess AmorJulie Idol, PA-C  ibuprofen (ADVIL,MOTRIN) 600 MG tablet Take 1 tablet (600 mg total) by mouth every 6 (six) hours as needed. 02/10/14   Burgess AmorJulie Idol, PA-C   Triage Vital: BP 129/73  Pulse 80  Temp(Src) 98 F (36.7 C) (Oral)  Resp 20  Ht 5\' 8"  (1.727 m)  Wt 175 lb (79.379 kg)  BMI 26.61 kg/m2  SpO2 97%  Physical Exam  Nursing note and vitals reviewed. Constitutional: He is oriented to person, place, and time. He appears well-developed and well-nourished. No distress.  HENT:  Head: Normocephalic and atraumatic.  Eyes: EOM are normal. Pupils are equal, round, and reactive to light.  Questionable mild scleral icterus.  Neck: Neck supple. No JVD present. No tracheal deviation present.  Cardiovascular: Normal rate and regular rhythm.   No murmur heard. Pulmonary/Chest: Effort normal and breath sounds normal. No respiratory distress. He has no wheezes. He has no rales.  Abdominal: Soft. He exhibits no mass. There is no tenderness. There is no guarding.  No hepatomegaly.  Musculoskeletal: Normal range of motion. He exhibits no edema.  Bilateral paralumbar muscle spasm worse on the right, No knee swelling or effusion  Lymphadenopathy:    He has no cervical adenopathy.  Neurological: He is alert and oriented to person, place, and time. No cranial nerve deficit. Coordination normal.  Skin: Skin is warm and dry. No rash noted.  Psychiatric: He has a normal mood and affect. His behavior is normal. Thought content normal.    ED Course  Procedures (including critical care time)  Medications  oxyCODONE (Oxy  IR/ROXICODONE) immediate release tablet 5 mg (5 mg Oral Given 03/22/14 0018)    DIAGNOSTIC STUDIES: Oxygen Saturation is 97% on RA, Adequate by my interpretation.    COORDINATION OF CARE: 12:09 AM-Discussed treatment plan which includes paine medication, CBC panel, CMP, and UA with pt at bedside and pt agreed to plan.     Labs Review Results for orders placed during the hospital encounter of 03/21/14  CBC WITH DIFFERENTIAL      Result Value Ref Range   WBC 5.8  4.0 - 10.5 K/uL   RBC 4.41  4.22 - 5.81 MIL/uL   Hemoglobin 13.7  13.0 - 17.0 g/dL   HCT 04.5 (*) 40.9 - 81.1 %   MCV 87.8  78.0 - 100.0 fL   MCH 31.1  26.0 - 34.0 pg   MCHC 35.4  30.0 - 36.0 g/dL   RDW 91.4  78.2 - 95.6 %   Platelets 212  150 - 400 K/uL   Neutrophils Relative % 72  43 - 77 %   Neutro Abs 4.2  1.7 - 7.7 K/uL   Lymphocytes Relative 18  12 - 46 %   Lymphs Abs 1.0  0.7 - 4.0 K/uL   Monocytes Relative 9  3 - 12 %   Monocytes Absolute 0.5  0.1 - 1.0 K/uL   Eosinophils Relative 1  0 - 5 %   Eosinophils Absolute 0.0  0.0 - 0.7 K/uL   Basophils Relative 0  0 - 1 %   Basophils Absolute 0.0  0.0 - 0.1 K/uL  COMPREHENSIVE METABOLIC PANEL      Result Value Ref Range   Sodium 139  137 - 147 mEq/L   Potassium 3.6 (*) 3.7 - 5.3 mEq/L   Chloride 104  96 - 112 mEq/L   CO2 24  19 - 32 mEq/L   Glucose, Bld 110 (*) 70 - 99 mg/dL   BUN 11  6 - 23 mg/dL   Creatinine, Ser 2.13  0.50 - 1.35 mg/dL   Calcium 9.0  8.4 - 08.6 mg/dL   Total Protein 6.7  6.0 - 8.3 g/dL   Albumin 3.9  3.5 - 5.2 g/dL   AST 30  0 - 37 U/L   ALT 32  0 - 53 U/L   Alkaline Phosphatase 59  39 - 117 U/L   Total Bilirubin 0.3  0.3 - 1.2 mg/dL   GFR calc non Af Amer 84 (*) >90 mL/min   GFR calc Af Amer >90  >90 mL/min  URINALYSIS, ROUTINE W REFLEX MICROSCOPIC      Result Value Ref Range   Color, Urine YELLOW  YELLOW   APPearance CLEAR  CLEAR   Specific Gravity, Urine <1.005 (*) 1.005 - 1.030   pH 6.0  5.0 - 8.0   Glucose, UA NEGATIVE   NEGATIVE mg/dL   Hgb urine dipstick NEGATIVE  NEGATIVE   Bilirubin Urine NEGATIVE  NEGATIVE   Ketones, ur NEGATIVE  NEGATIVE mg/dL   Protein, ur NEGATIVE  NEGATIVE mg/dL   Urobilinogen, UA 0.2  0.0 - 1.0 mg/dL   Nitrite NEGATIVE  NEGATIVE   Leukocytes, UA NEGATIVE  NEGATIVE   MDM   Final diagnoses:  Low back pain  Arthralgia   Back pain and arthralgias consistent with a viral illness. Because of recent diagnosis of hepatitis C, hepatic function will be checked. Acetaminophen will be avoided she was given a dose of oxycodone for pain.  He got good relief of pain with the above-noted treatment. All liver enzymes are completely normal and bilirubin is actually borderline low. No evidence of active hepatitis C infection. He'll be treated symptomatically and prescriptions are given for oxycodone, and naproxen, and cyclobenzaprine.   I personally performed the services described in this documentation, which was scribed in my presence. The recorded information has been reviewed and is accurate.       Dione Boozeavid Canesha Tesfaye, MD 03/22/14 [phone]

## 2015-05-30 ENCOUNTER — Emergency Department (HOSPITAL_COMMUNITY)
Admission: EM | Admit: 2015-05-30 | Discharge: 2015-05-30 | Disposition: A | Discharge status: Home / Self Care | Payer: Self-pay | Attending: Emergency Medicine | Admitting: Emergency Medicine

## 2015-05-30 ENCOUNTER — Encounter (HOSPITAL_COMMUNITY): Payer: Self-pay | Admitting: Emergency Medicine

## 2015-05-30 ENCOUNTER — Emergency Department (HOSPITAL_COMMUNITY): Payer: Self-pay

## 2015-05-30 DIAGNOSIS — G8929 Other chronic pain: Secondary | ICD-10-CM | POA: Insufficient documentation

## 2015-05-30 DIAGNOSIS — Z79899 Other long term (current) drug therapy: Secondary | ICD-10-CM | POA: Insufficient documentation

## 2015-05-30 DIAGNOSIS — Z8619 Personal history of other infectious and parasitic diseases: Secondary | ICD-10-CM | POA: Insufficient documentation

## 2015-05-30 DIAGNOSIS — M25571 Pain in right ankle and joints of right foot: Secondary | ICD-10-CM | POA: Insufficient documentation

## 2015-05-30 DIAGNOSIS — L989 Disorder of the skin and subcutaneous tissue, unspecified: Secondary | ICD-10-CM | POA: Insufficient documentation

## 2015-05-30 DIAGNOSIS — Z720 Tobacco use: Secondary | ICD-10-CM | POA: Insufficient documentation

## 2015-05-30 DIAGNOSIS — R21 Rash and other nonspecific skin eruption: Secondary | ICD-10-CM | POA: Insufficient documentation

## 2015-05-30 DIAGNOSIS — M549 Dorsalgia, unspecified: Secondary | ICD-10-CM | POA: Insufficient documentation

## 2015-05-30 LAB — COMPREHENSIVE METABOLIC PANEL
ALBUMIN: 4.1 g/dL (ref 3.5–5.0)
ALT: 131 U/L — AB (ref 17–63)
AST: 74 U/L — AB (ref 15–41)
Alkaline Phosphatase: 74 U/L (ref 38–126)
Anion gap: 8 (ref 5–15)
BILIRUBIN TOTAL: 0.5 mg/dL (ref 0.3–1.2)
BUN: 19 mg/dL (ref 6–20)
CHLORIDE: 103 mmol/L (ref 101–111)
CO2: 27 mmol/L (ref 22–32)
CREATININE: 1.1 mg/dL (ref 0.61–1.24)
Calcium: 9 mg/dL (ref 8.9–10.3)
GFR calc Af Amer: >60 mL/min (ref >60)
Glucose, Bld: 95 mg/dL (ref 65–99)
POTASSIUM: 4.2 mmol/L (ref 3.5–5.1)
SODIUM: 138 mmol/L (ref 135–145)
Total Protein: 7 g/dL (ref 6.5–8.1)

## 2015-05-30 LAB — CBC WITH DIFFERENTIAL/PLATELET
BASOS ABS: 0 10*3/uL (ref 0.0–0.1)
BASOS PCT: 1 % (ref 0–1)
Eosinophils Absolute: 0.2 10*3/uL (ref 0.0–0.7)
Eosinophils Relative: 4 % (ref 0–5)
HEMATOCRIT: 40.5 % (ref 39.0–52.0)
Hemoglobin: 13.9 g/dL (ref 13.0–17.0)
Lymphocytes Relative: 43 % (ref 12–46)
Lymphs Abs: 2.8 10*3/uL (ref 0.7–4.0)
MCH: 30.4 pg (ref 26.0–34.0)
MCHC: 34.3 g/dL (ref 30.0–36.0)
MCV: 88.6 fL (ref 78.0–100.0)
MONO ABS: 0.5 10*3/uL (ref 0.1–1.0)
Monocytes Relative: 7 % (ref 3–12)
Neutro Abs: 3.1 10*3/uL (ref 1.7–7.7)
Neutrophils Relative %: 45 % (ref 43–77)
Platelets: 200 10*3/uL (ref 150–400)
RBC: 4.57 MIL/uL (ref 4.22–5.81)
RDW: 13.6 % (ref 11.5–15.5)
WBC: 6.6 10*3/uL (ref 4.0–10.5)

## 2015-05-30 LAB — LIPASE, BLOOD: LIPASE: 23 U/L (ref 22–51)

## 2015-05-30 MED ORDER — DOXYCYCLINE HYCLATE 100 MG PO CAPS: 100.0000 mg | ORAL_CAPSULE | Freq: Two times a day (BID) | ORAL | Status: DC

## 2015-05-30 MED ORDER — NAPROXEN 500 MG PO TABS: 500.0000 mg | ORAL_TABLET | Freq: Two times a day (BID) | ORAL | Status: DC

## 2015-05-30 NOTE — ED Notes (Signed)
Removed tick few weeks ago left foot, notice boil to foot and pt burst it.  Notice yellow drainage.  Notice sores to both feet and right arm.  Pt says he has history of Hep C and family notice pt is jaundice.  Pt says he do not have regular PCP.

## 2015-05-30 NOTE — ED Notes (Signed)
MD at bedside. 

## 2015-05-30 NOTE — Discharge Instructions (Signed)
Take antibolic as directed for the next 7 days. Take the Naprosyn as needed for the ankle pain. As we discussed no elevation of bilirubin and therefore no evidence of jaundice. Resource guide provided below to help you find a local doctor. Make an appointment to follow-up with orthopedics. Referral to Dr. Hilda LiasKeeling provided. Return for any new or worse symptoms.   Emergency Department Resource Guide 1) Find a Doctor and Pay Out of Pocket Although you won't have to find out who is covered by your insurance plan, it is a good idea to ask around and get recommendations. You will then need to call the office and see if the doctor you have chosen will accept you as a new patient and what types of options they offer for patients who are self-pay. Some doctors offer discounts or will set up payment plans for their patients who do not have insurance, but you will need to ask so you aren't surprised when you get to your appointment.  2) Contact Your Local Health Department Not all health departments have doctors that can see patients for sick visits, but many do, so it is worth a call to see if yours does. If you don't know where your local health department is, you can check in your phone book. The CDC also has a tool to help you locate your state's health department, and many state websites also have listings of all of their local health departments.  3) Find a Walk-in Clinic If your illness is not likely to be very severe or complicated, you may want to try a walk in clinic. These are popping up all over the country in pharmacies, drugstores, and shopping centers. They're usually staffed by nurse practitioners or physician assistants that have been trained to treat common illnesses and complaints. They're usually fairly quick and inexpensive. However, if you have serious medical issues or chronic medical problems, these are probably not your best option.  No Primary Care Doctor: - Call Health Connect at   [phone] - they can help you locate a primary care doctor that  accepts your insurance, provides certain services, etc. - Physician Referral Service- [phone]  Chronic Pain Problems: Organization         Address  Phone   Notes  [facility]  [phone] Patients need to be referred by their primary care doctor.   Medication Assistance: Organization         Address  Phone   Notes  [facility] [address] [place], [place] [address] [phone] --Must be a resident of [facility] [place] -- Must have NO insurance coverage whatsoever (no Medicaid/ Medicare, etc.) -- The pt. MUST have a primary care doctor that directs their care regularly and follows them in the community   MedAssist  [phone]   [employer]  [phone]    Agencies that provide inexpensive medical care: Organization         Address  Phone   Notes  [facility]  [phone]   [facility]    [phone]   [facility] [address] [place], [place] [address] [phone]   [facility] [place] [address], [place] [phone]   [facility]anned [facility]    [phone]   [facility]    [phone]) [phone]   [facility]  [address], [place] Phone:  (  [phone], Fax:  [phone] Hours of Operation:  9 am - 6 pm, M-F.  Also accepts Medicaid/Medicare and self-pay.  [facility] [place] [address], [address], [place] Phone: [phone], Fax: [phone]. Hours of Operation:  8:30 am - 5:30 pm, M-F.  Also accepts Medicaid and self-pay.  [facility] [address], [place] Phone: [phone]   [place], [place], [place] [phone], Ext. [phone] Mondays & Thursdays: 7-9 AM.  First 15 patients are seen on a first come, first serve basis.     [place] Providers:  Organization         Address  Phone   Notes  [facility] [address],  [phone] Also accepts self-pay patients.  [facility] [address] [place], [place]  [phone]   [place], [address], [place] [phone]   [facility] [address] [place] [phone]   Lucianne Lei [address], [place]   [phone] Only accepts [place] Access [place] patients after they have their name applied to their card.   Self-Pay (no insurance) in [facility]:  Organization         Address  Phone   Notes  Sickle Cell Patients, [facility] [place] [facility] [place] [phone]   [facility] [place] [phone]   [facility] [place]  [place], [place], [place] [phone]   [facility]/Dr. Osei-Bonsu  [address], [place] or [address], [place] [phone] Phone number for both [place] and [place] locations is the same.  Urgent Medical and [facility] [address] [place] [phone]   [facility] [address] [place] or [address] [phone]   [facility] [address], [place] [phone], phone; [phone], fax Sees patients 1st and 3rd Saturday of every month.  Must not qualify for public or private insurance (i.e. Medicaid, Medicare, [place] Health Choice, Veterans' Benefits)  Household income should be no more than 200% of the poverty level The clinic cannot treat you if you are pregnant or think you are pregnant  Sexually transmitted diseases are not treated at the clinic.    Dental Care: Organization         Address  Phone  Notes  [facility]  Department of [facility] [phone] Accepts children up to age 68 who are enrolled in [place] or [place]; pregnant women with a Medicaid card; and children who have applied for Medicaid or [place] Health Choice, but were declined, whose parents can pay a reduced fee at time of service.  [facility] Department of [facility]  [address], [place] [phone] Accepts children up to age 61 who are enrolled in [place] or [place]; pregnant women with a Medicaid card; and children who have applied for Medicaid or [place] Health Choice, but were declined, whose parents can pay a reduced fee at time of service.  [place] Access PRO[facility]  [place] [phone] Patients are seen by appointment only. Walk-ins  are not accepted. [place] will see patients 35 years of age and older. Monday - Tuesday (8am-5pm) Most Wednesdays (8:30-5pm) $30 per visit, cash only  [facility] Access PROGRAM  [address], [facility] [phone] Patients are seen by appointment only. Walk-ins are not accepted. [place] will see patients 30 years of age and older. One Wednesday Evening (Monthly: Volunteer Based).  $30 per visit, cash only  [place]  [phone] for adults; Children under age 99, call Graduate Pediatric Dentistry at [phone]. Children aged 48-14, please call [phone] to request a pediatric application.  Dental services are provided in all areas of dental care including fillings, crowns and bridges, complete and partial dentures, implants, gum treatment, root canals, and extractions. Preventive care is also provided. Treatment is provided to both adults and children. Patients are selected via a lottery and there is often a waiting list.   [facility] [address], [place]  [phone]  [email or web address]   [facility] [address] [place], [place] [phone], Ext. [phone] Second and Fourth Thursday of each month, opens at 6:30 AM; Clinic ends at 9 AM.  Patients are seen on a first-come first-served basis, and a limited number are seen during each clinic.   [facility]  [address] Hill[place] Danker [place], [place] [phone]   Eligibility Requirements You must have lived in [place], [place], or [place] counties for at least the last three months.   You cannot be eligible for state or federal sponsored [employer], including [employer], [place], or [employer].   You generally cannot be eligible for healthcare insurance through your employer.    How to apply: Eligibility screenings are held every Tuesday and Wednesday afternoon from 1:00 pm until 4:00 pm. You do not need an appointment for the interview!  [facility] [address] [place], [place]   [place] Department  [place]  [phone]    Behavioral Health Resources in the Community: Intensive Outpatient Programs Organization         Address  Phone  Notes  [place] [address], [place], [place] [phone]   [facility]patient [address] [place], [place]   ADS: Alcohol & Drug Svcs [address], [place], [place]   [place] [address]  [place], [place] or [phone]   Substance Abuse Resources Organization         Address  Phone  Notes  Alcohol and Drug Services  [phone]   [place]  [phone]   The [place]   Chinita Pester  [phone]   Residential & Outpatient Substance Abuse Program  [phone]   Psychological Services Organization          Address  Phone  Notes  [facility] [place]  [place]  [phone]   [place] [address], [place] [phone] or [phone]    Mobile Crisis Teams Organization         Address  Phone  Notes  [facility], Mobile Crisis Care Unit  [phone]   [facility]ive Psychotherapeutic Services  [address] [place], [place]   [facility] [address], [place] [phone]  Self-Help/Support Groups Organization         Address  Phone             Notes  [facility] [place] - variety of support groups  [place] Call for more information  Narcotics Anonymous (NA), Caring Services [address], [employer] [place]  2 meetings at this location   Special educational needs teacher         Address  Phone  Notes  [facility] Residential Treatment [place],    [place]  [phone]   [facility]  [address] [place] [phone], [place], [place]   [place], [place] [phone] Admissions: 8am-3pm M-F  Incentives Substance [place] [address]    [place], [place] [phone]   [facility] [address] [place], [place], [place]   [facility] [address]  [place], [place]   [facility] Programs - Intensive Outpatient [address], [place], [place]   [facility] [address]  [place], [place] [phone] or [phone]   [facility] Services (RTS) [address] [place], [place] Accepts Medicaid  [facility] [place] [address]  [place] [phone] Substance Abuse/Addiction Treatment   [facility] Organization         Address  Phone  Notes  [facility]  [phone]   Domenic Schwab, PhD [address] Arlis Porta [place], [place]   [phone] or [phone]   [place], [place] [phone]   [facility] [address] [place], [place] [phone] Insurance/Medicaid/sponsorship through [facility] and Families [address], [address]                                    [place], [place] [phone] [place] [address], [place] [phone]    Dr. Adele Schilder  [phone]   [facility] Dept. 1) [address]  2) [place] 3)  [place] [address], [place] [phone]  [phone]   [place] [phone] or [phone] (After Hours)

## 2015-05-30 NOTE — ED Notes (Signed)
At discharge patient inquired about receiving a prescription for "the medication I got last time"; immediate release Oxycodone, because he didn't think the Naprosyn would help. I did ask Dr. Deretha EmoryZackowski who denied the patients request. Patient and his girlfriend upset about not receiving a prescription for the immediate release Oxycodone.

## 2015-05-30 NOTE — ED Provider Notes (Signed)
CSN: [id number]     Arrival date & time 05/30/15  1246 History   First MD Initiated Contact with Patient 05/30/15 1340     Chief Complaint  Patient presents with  . Tick Removal  . Ankle Pain  . Back Pain  . Jaundice    pt family     (Consider location/radiation/quality/duration/timing/severity/associated sxs/prior Treatment) Patient is a 34 y.o. male presenting with ankle pain and back pain. The history is provided by the patient.  Ankle Pain Associated symptoms: back pain   Associated symptoms: no fever   Back Pain Associated symptoms: no abdominal pain, no chest pain, no dysuria, no fever and no headaches    patient presents with 3 concerns. Patient had a tick bite a few weeks ago. At the site of the tick bite he thinks he developed secondary infection and now has a some scab itchy areas on the left foot and up along the left leg. Also has similar findings on the right forearm. Patient also has a history history of hepatitis C is concerned about being jaundice. Says his eyes look yellow. Also his joints are aching. His joints normally ache when his bilirubins elevated. Patient also with complaint of right ankle pain he had a fracture that required open reduction of internal fixation there in the past has had some chronic pain associated with appears been worse of late. No new injuries.  Past Medical History  Diagnosis Date  . Hepatitis C    Past Surgical History  Procedure Laterality Date  . Orthopedic surgery     History reviewed. No pertinent family history. History  Substance Use Topics  . Smoking status: Current Every Day Smoker  . Smokeless tobacco: Not on file  . Alcohol Use: No    Review of Systems  Constitutional: Negative for fever.  HENT: Negative for congestion.   Eyes: Negative for visual disturbance.  Respiratory: Negative for shortness of breath.   Cardiovascular: Negative for chest pain.  Gastrointestinal: Negative for abdominal pain.  Genitourinary:  Negative for dysuria.  Musculoskeletal: Positive for back pain and arthralgias.  Skin: Positive for rash and wound.  Neurological: Negative for headaches.  Hematological: Does not bruise/bleed easily.  Psychiatric/Behavioral: Negative for confusion.      Allergies  Review of patient's allergies indicates no known allergies.  Home Medications   Prior to Admission medications   Medication Sig Start Date End Date Taking? Authorizing Provider  ibuprofen (ADVIL,MOTRIN) 200 MG tablet Take 200 mg by mouth every 6 (six) hours as needed for moderate pain.   Yes Historical Provider, MD  Melatonin 3 MG CAPS Take 1 capsule by mouth daily as needed (sleep).   Yes Historical Provider, MD  doxycycline (VIBRAMYCIN) 100 MG capsule Take 1 capsule (100 mg total) by mouth 2 (two) times daily. 05/30/15   Vanetta Mulders, MD  naproxen (NAPROSYN) 500 MG tablet Take 1 tablet (500 mg total) by mouth 2 (two) times daily. 05/30/15   Vanetta Mulders, MD   BP 108/83 mmHg  Pulse 87  Temp(Src) 97.9 F (36.6 C) (Oral)  Resp 16  Ht  (1.727 m)  Wt 170 lb (77.111 kg)  BMI 25.85 kg/m2  SpO2 98% Physical Exam  Constitutional: He is oriented to person, place, and time. He appears well-developed and well-nourished. No distress.  HENT:  Head: Normocephalic and atraumatic.  Mouth/Throat: Oropharynx is clear and moist.  Eyes: Conjunctivae and EOM are normal. Pupils are equal, round, and reactive to light. Scleral icterus is present.  Neck:  Normal range of motion. Neck supple.  Cardiovascular: Normal rate, regular rhythm and normal heart sounds.   No murmur heard. Pulmonary/Chest: Effort normal and breath sounds normal. No respiratory distress.  Abdominal: Soft. Bowel sounds are normal. There is no tenderness.  Musculoskeletal: Normal range of motion. He exhibits no edema.  Right ankle with well-healed surgical scar no swelling. Mild tenderness to palpation. Good cap refill good range of motion.  Neurological:  He is alert and oriented to person, place, and time. No cranial nerve deficit. He exhibits normal muscle tone. Coordination normal.  Skin: Skin is warm. No rash noted.  Patient was scabbed over skin lesions around the left foot with some erythema but no cellulitis no purulent discharge also some up along the left leg. Some on the right forearm. Again no secondary infection.  Nursing note and vitals reviewed.   ED Course  Procedures (including critical care time) Labs Review Labs Reviewed  COMPREHENSIVE METABOLIC PANEL - Abnormal; Notable for the following:    AST 74 (*)    ALT 131 (*)    All other components within normal limits  LIPASE, BLOOD  CBC WITH DIFFERENTIAL/PLATELET   Results for orders placed or performed during the hospital encounter of 05/30/15  Comprehensive metabolic panel  Result Value Ref Range   Sodium 138 135 - 145 mmol/L   Potassium 4.2 3.5 - 5.1 mmol/L   Chloride 103 101 - 111 mmol/L   CO2 27 22 - 32 mmol/L   Glucose, Bld 95 65 - 99 mg/dL   BUN 19 6 - 20 mg/dL   Creatinine, Ser 8.11 0.61 - 1.24 mg/dL   Calcium 9.0 8.9 - 91.4 mg/dL   Total Protein 7.0 6.5 - 8.1 g/dL   Albumin 4.1 3.5 - 5.0 g/dL   AST 74 (H) 15 - 41 U/L   ALT 131 (H) 17 - 63 U/L   Alkaline Phosphatase 74 38 - 126 U/L   Total Bilirubin 0.5 0.3 - 1.2 mg/dL   GFR calc non Af Amer >60 >60 mL/min   GFR calc Af Amer >60 >60 mL/min   Anion gap 8 5 - 15  Lipase, blood  Result Value Ref Range   Lipase 23 22 - 51 U/L  CBC with Differential/Platelet  Result Value Ref Range   WBC 6.6 4.0 - 10.5 K/uL   RBC 4.57 4.22 - 5.81 MIL/uL   Hemoglobin 13.9 13.0 - 17.0 g/dL   HCT 78.2 95.6 - 21.3 %   MCV 88.6 78.0 - 100.0 fL   MCH 30.4 26.0 - 34.0 pg   MCHC 34.3 30.0 - 36.0 g/dL   RDW 08.6 57.8 - 46.9 %   Platelets 200 150 - 400 K/uL   Neutrophils Relative % 45 43 - 77 %   Neutro Abs 3.1 1.7 - 7.7 K/uL   Lymphocytes Relative 43 12 - 46 %   Lymphs Abs 2.8 0.7 - 4.0 K/uL   Monocytes Relative 7 3 - 12 %    Monocytes Absolute 0.5 0.1 - 1.0 K/uL   Eosinophils Relative 4 0 - 5 %   Eosinophils Absolute 0.2 0.0 - 0.7 K/uL   Basophils Relative 1 0 - 1 %   Basophils Absolute 0.0 0.0 - 0.1 K/uL    Imaging Review Dg Ankle Complete Right  05/30/2015   CLINICAL DATA:  Chronic right ankle pain without recent injury. Initial encounter.  EXAM: RIGHT ANKLE - COMPLETE 3+ VIEW  COMPARISON:  None.  FINDINGS: Status post surgical internal fixation  of distal right fibula as well as medial malleolus. No acute fracture or dislocation is noted. Joint spaces are intact. No soft tissue abnormality is noted.  IMPRESSION: Postsurgical changes as described above. No acute abnormality seen in the right ankle.   Electronically Signed   By: Lupita RaiderJames  Green Jr, M.D.   On: 05/30/2015 15:00     EKG Interpretation None      MDM   Final diagnoses:  Ankle pain, chronic, right  Skin lesions    Patient here with 3 separate concerns. The first one was the skin lesions has some on his left foot and leg and right forearm they do itch they could have an allergic basis to it. There was a tick bite to the left foot few weeks ago. Also MRSA secondary infection is a possibility. Wounds are erythematous no purulent drainage. Will treat with doxycycline to cover the tick exposure as well as the skin lesions.  The other concern patient has a history of hepatitis C and is felt like he has been jaundiced lately and his joints have been hurting and that tends to happen when his bilirubin is elevated. However bilirubin is normal today no significant transaminase abnormalities. Resource guide provided to help patient find local doctors to follow first hepatitis C but no acute changes noted here today.  Third concern is that he had a previous right ankle injury with hardware and that has been acting up pain wise. X-ray of that area shows no acute abnormalities. Referral to orthopedics provided for this chronic pain perhaps reconsideration remove  the hardware that would be based on orthopedics. Will treat with Naprosyn for that.  Patient also requested a refill of pain medication he had had filled here in the past is a long-acting narcotic. Told patient that was not necessary.  Vanetta MuldersScott Karsten Vaughn, MD 05/30/15 1536

## 2015-10-23 ENCOUNTER — Other Ambulatory Visit (HOSPITAL_COMMUNITY)
Admission: RE | Admit: 2015-10-23 | Discharge: 2015-10-23 | Disposition: A | Discharge status: Home / Self Care | Payer: BLUE CROSS/BLUE SHIELD | Source: Ambulatory Visit | Attending: Family Medicine | Admitting: Family Medicine

## 2015-10-23 ENCOUNTER — Ambulatory Visit (HOSPITAL_COMMUNITY)
Admission: RE | Admit: 2015-10-23 | Discharge: 2015-10-23 | Disposition: A | Discharge status: Home / Self Care | Payer: BLUE CROSS/BLUE SHIELD | Source: Ambulatory Visit | Attending: Family Medicine | Admitting: Family Medicine

## 2015-10-23 ENCOUNTER — Other Ambulatory Visit (HOSPITAL_COMMUNITY): Payer: Self-pay | Admitting: Family Medicine

## 2015-10-23 DIAGNOSIS — M79671 Pain in right foot: Secondary | ICD-10-CM | POA: Insufficient documentation

## 2015-10-23 DIAGNOSIS — M4854XA Collapsed vertebra, not elsewhere classified, thoracic region, initial encounter for fracture: Secondary | ICD-10-CM | POA: Diagnosis not present

## 2015-10-23 DIAGNOSIS — G8929 Other chronic pain: Secondary | ICD-10-CM

## 2015-10-23 DIAGNOSIS — D510 Vitamin B12 deficiency anemia due to intrinsic factor deficiency: Secondary | ICD-10-CM | POA: Diagnosis present

## 2015-10-23 DIAGNOSIS — M545 Low back pain: Secondary | ICD-10-CM | POA: Diagnosis not present

## 2015-10-23 DIAGNOSIS — E782 Mixed hyperlipidemia: Secondary | ICD-10-CM | POA: Insufficient documentation

## 2015-10-23 DIAGNOSIS — R5383 Other fatigue: Secondary | ICD-10-CM | POA: Diagnosis not present

## 2015-10-23 DIAGNOSIS — E784 Other hyperlipidemia: Secondary | ICD-10-CM | POA: Insufficient documentation

## 2015-10-23 DIAGNOSIS — M25571 Pain in right ankle and joints of right foot: Secondary | ICD-10-CM | POA: Insufficient documentation

## 2015-10-23 DIAGNOSIS — M25512 Pain in left shoulder: Secondary | ICD-10-CM | POA: Diagnosis not present

## 2015-10-23 LAB — CBC WITH DIFFERENTIAL/PLATELET
BASOS PCT: 0 %
Basophils Absolute: 0.1 10*3/uL (ref 0.0–0.1)
EOS ABS: 0.2 10*3/uL (ref 0.0–0.7)
EOS PCT: 2 %
HCT: 41.9 % (ref 39.0–52.0)
HEMOGLOBIN: 15.2 g/dL (ref 13.0–17.0)
Lymphocytes Relative: 34 %
Lymphs Abs: 4.2 10*3/uL — ABNORMAL HIGH (ref 0.7–4.0)
MCH: 31.6 pg (ref 26.0–34.0)
MCHC: 36.3 g/dL — AB (ref 30.0–36.0)
MCV: 87.1 fL (ref 78.0–100.0)
MONOS PCT: 5 %
Monocytes Absolute: 0.6 10*3/uL (ref 0.1–1.0)
NEUTROS PCT: 59 %
Neutro Abs: 7.2 10*3/uL (ref 1.7–7.7)
PLATELETS: 215 10*3/uL (ref 150–400)
RBC: 4.81 MIL/uL (ref 4.22–5.81)
RDW: 12.7 % (ref 11.5–15.5)
WBC: 12.3 10*3/uL — ABNORMAL HIGH (ref 4.0–10.5)

## 2015-10-23 LAB — LIPID PANEL
CHOLESTEROL: 188 mg/dL (ref 0–200)
HDL: 40 mg/dL — ABNORMAL LOW (ref >40)
LDL CALC: 114 mg/dL — AB (ref 0–99)
TRIGLYCERIDES: 169 mg/dL — AB (ref <150)
Total CHOL/HDL Ratio: 4.7 RATIO
VLDL: 34 mg/dL (ref 0–40)

## 2015-10-23 LAB — TSH: TSH: 5.056 u[IU]/mL — AB (ref 0.350–4.500)

## 2015-10-24 LAB — HEPATITIS C ANTIBODY

## 2015-10-24 LAB — T4: T4, Total: 9.6 ug/dL (ref 4.5–12.0)

## 2015-10-24 LAB — HEPATITIS B SURFACE ANTIGEN: Hepatitis B Surface Ag: NEGATIVE

## 2015-11-06 ENCOUNTER — Other Ambulatory Visit (HOSPITAL_COMMUNITY): Payer: Self-pay | Admitting: Family Medicine

## 2015-11-06 DIAGNOSIS — M546 Pain in thoracic spine: Secondary | ICD-10-CM

## 2015-11-21 ENCOUNTER — Ambulatory Visit (HOSPITAL_COMMUNITY)
Admission: RE | Admit: 2015-11-21 | Discharge: 2015-11-21 | Disposition: A | Discharge status: Home / Self Care | Payer: BLUE CROSS/BLUE SHIELD | Source: Ambulatory Visit | Attending: Family Medicine | Admitting: Family Medicine

## 2015-11-21 DIAGNOSIS — M545 Low back pain: Secondary | ICD-10-CM | POA: Diagnosis not present

## 2015-11-21 DIAGNOSIS — M546 Pain in thoracic spine: Secondary | ICD-10-CM

## 2015-12-11 ENCOUNTER — Encounter: Payer: Self-pay | Admitting: *Deleted

## 2015-12-26 ENCOUNTER — Ambulatory Visit: Payer: BLUE CROSS/BLUE SHIELD | Admitting: Orthopedic Surgery

## 2016-01-20 ENCOUNTER — Ambulatory Visit (INDEPENDENT_AMBULATORY_CARE_PROVIDER_SITE_OTHER): Payer: BLUE CROSS/BLUE SHIELD | Admitting: Orthopedic Surgery

## 2016-01-20 ENCOUNTER — Encounter: Payer: Self-pay | Admitting: Orthopedic Surgery

## 2016-01-20 VITALS — BP 125/83 | Ht 68.0 in | Wt 161.0 lb

## 2016-01-20 DIAGNOSIS — S82841S Displaced bimalleolar fracture of right lower leg, sequela: Secondary | ICD-10-CM | POA: Diagnosis not present

## 2016-01-20 DIAGNOSIS — T8484XA Pain due to internal orthopedic prosthetic devices, implants and grafts, initial encounter: Secondary | ICD-10-CM

## 2016-01-20 MED ORDER — NABUMETONE 500 MG PO TABS: 500.0000 mg | ORAL_TABLET | Freq: Two times a day (BID) | ORAL | Status: DC

## 2016-01-20 NOTE — Progress Notes (Signed)
Patient ID: Craig Terry, male   DOB: July 15, 1981, 35 y.o.   MRN: [id number]  Chief Complaint  Patient presents with  . Ankle Problem    loose hardware Rt ankle, DOS 2008    HPI Craig Terry is a 35 y.o. male.  Presents for evaluation of his right ankle. Status post open treatment internal [place]. 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 Also has chronic back pain.  Complains of ankle pain stiffness giving way some throbbing and aching. Pain is constant worse in the morning ranges from 6-10 out of 10. X-rays show intact hardware including syndesmosis screw. He presents for a brace took some Aleve and ibuprofen with no relief. He takes oxycodone 10 for his back and that does help his ankle somewhat     Review of Systems Review of Systems Numbness tingling weakness hayfever joint limb pain stiff joints back pain all other systems negative  Past Medical History  Diagnosis Date  . Hepatitis C     Past Surgical History  Procedure Laterality Date  . Orthopedic surgery      No family history on file.  Social History Social History  Substance Use Topics  . Smoking status: Current Every Day Smoker  . Smokeless tobacco: None  . Alcohol Use: No    No Known Allergies  Current Outpatient Prescriptions  Medication Sig Dispense Refill  . LORazepam (ATIVAN) 1 MG tablet 1 mg.    . Oxycodone HCl 10 MG TABS Take by mouth.    . nabumetone (RELAFEN) 500 MG tablet Take 1 tablet (500 mg total) by mouth 2 (two) times daily. 60 tablet 5   No current facility-administered medications for this visit.       Physical Exam Blood pressure 125/83, height  (1.727 m), weight 161 lb (73.029 kg). Physical Exam The patient is well developed well nourished and well groomed.  Orientation to person place and time is normal  Mood is pleasant.  Ambulatory status no limp without assistive device  Right Ankle examination: Inspection reveals no tenderness  or swelling  Range of motion is less on the right than the left by 5 dorsiflexion  Ankle stability tests are normal  Motor exam shows no atrophy  Skin shows normal incisions with no Tinel sign neurovascular exam otherwise intact  Opposite ankle shows no deformity. With normal range of motion.    Data Reviewed  I reviewed the x-ray and independently interpreted as 3 views of the ankle and hardware is intact syndesmosis screw is a threaded screw with a smooth shaft hits right at the plafond is intact   Assessment    Ankle fracture:  , Ankle pain with intact hardware and some stiffness    Plan At [facility]O brace [facility] for that and take Relafen 5 mg twice a day  Follow-up as needed

## 2016-01-20 NOTE — Patient Instructions (Signed)
Medication sent to pharmacy Prescription given for ankle brace

## 2016-02-25 ENCOUNTER — Emergency Department (HOSPITAL_COMMUNITY)
Admission: EM | Admit: 2016-02-25 | Discharge: 2016-02-26 | Disposition: A | Discharge status: Home / Self Care | Payer: BLUE CROSS/BLUE SHIELD | Attending: Emergency Medicine | Admitting: Emergency Medicine

## 2016-02-25 ENCOUNTER — Encounter (HOSPITAL_COMMUNITY): Payer: Self-pay | Admitting: *Deleted

## 2016-02-25 DIAGNOSIS — R197 Diarrhea, unspecified: Secondary | ICD-10-CM | POA: Diagnosis not present

## 2016-02-25 DIAGNOSIS — F17200 Nicotine dependence, unspecified, uncomplicated: Secondary | ICD-10-CM | POA: Diagnosis not present

## 2016-02-25 DIAGNOSIS — R112 Nausea with vomiting, unspecified: Secondary | ICD-10-CM

## 2016-02-25 DIAGNOSIS — Z79899 Other long term (current) drug therapy: Secondary | ICD-10-CM | POA: Diagnosis not present

## 2016-02-25 LAB — CBC WITH DIFFERENTIAL/PLATELET
BASOS ABS: 0 10*3/uL (ref 0.0–0.1)
BASOS PCT: 0 %
Eosinophils Absolute: 0.1 10*3/uL (ref 0.0–0.7)
Eosinophils Relative: 2 %
HEMATOCRIT: 45 % (ref 39.0–52.0)
HEMOGLOBIN: 15.5 g/dL (ref 13.0–17.0)
LYMPHS PCT: 31 %
Lymphs Abs: 2.6 10*3/uL (ref 0.7–4.0)
MCH: 30.9 pg (ref 26.0–34.0)
MCHC: 34.4 g/dL (ref 30.0–36.0)
MCV: 89.6 fL (ref 78.0–100.0)
MONO ABS: 0.8 10*3/uL (ref 0.1–1.0)
Monocytes Relative: 10 %
NEUTROS ABS: 4.8 10*3/uL (ref 1.7–7.7)
NEUTROS PCT: 57 %
Platelets: 218 10*3/uL (ref 150–400)
RBC: 5.02 MIL/uL (ref 4.22–5.81)
RDW: 13.2 % (ref 11.5–15.5)
WBC: 8.3 10*3/uL (ref 4.0–10.5)

## 2016-02-25 LAB — URINALYSIS, ROUTINE W REFLEX MICROSCOPIC
BILIRUBIN URINE: NEGATIVE
Glucose, UA: NEGATIVE mg/dL
HGB URINE DIPSTICK: NEGATIVE
KETONES UR: NEGATIVE mg/dL
Leukocytes, UA: NEGATIVE
Nitrite: NEGATIVE
PH: 6 (ref 5.0–8.0)
Protein, ur: NEGATIVE mg/dL
SPECIFIC GRAVITY, URINE: 1.01 (ref 1.005–1.030)

## 2016-02-25 LAB — BASIC METABOLIC PANEL
ANION GAP: 9 (ref 5–15)
BUN: 11 mg/dL (ref 6–20)
CALCIUM: 9.1 mg/dL (ref 8.9–10.3)
CHLORIDE: 105 mmol/L (ref 101–111)
CO2: 26 mmol/L (ref 22–32)
Creatinine, Ser: 0.83 mg/dL (ref 0.61–1.24)
GFR calc non Af Amer: >60 mL/min (ref >60)
GLUCOSE: 94 mg/dL (ref 65–99)
POTASSIUM: 3.9 mmol/L (ref 3.5–5.1)
Sodium: 140 mmol/L (ref 135–145)

## 2016-02-25 LAB — HEPATIC FUNCTION PANEL
ALT: 263 U/L — AB (ref 17–63)
AST: 143 U/L — AB (ref 15–41)
Albumin: 4.4 g/dL (ref 3.5–5.0)
Alkaline Phosphatase: 91 U/L (ref 38–126)
BILIRUBIN DIRECT: 0.1 mg/dL (ref 0.1–0.5)
BILIRUBIN INDIRECT: 0.6 mg/dL (ref 0.3–0.9)
BILIRUBIN TOTAL: 0.7 mg/dL (ref 0.3–1.2)
Total Protein: 7.4 g/dL (ref 6.5–8.1)

## 2016-02-25 LAB — LIPASE, BLOOD: Lipase: 24 U/L (ref 11–51)

## 2016-02-25 MED ORDER — SODIUM CHLORIDE 0.9 % IV BOLUS (SEPSIS)
1000.0000 mL | Freq: Once | INTRAVENOUS | Status: AC
  Administered 2016-02-25: 1000 mL via INTRAVENOUS

## 2016-02-25 MED ORDER — ONDANSETRON HCL 4 MG/2ML IJ SOLN
4.0000 mg | Freq: Once | INTRAMUSCULAR | Status: AC
  Administered 2016-02-25: 4 mg via INTRAVENOUS
  Filled 2016-02-25: qty 2

## 2016-02-25 MED ORDER — ONDANSETRON HCL 4 MG PO TABS
4.0000 mg | ORAL_TABLET | Freq: Four times a day (QID) | ORAL | Status: AC
Start: 2016-02-25 — End: ?

## 2016-02-25 NOTE — ED Notes (Signed)
Pt c/o vomiting and diarrhea x 2 days.

## 2016-02-25 NOTE — Discharge Instructions (Signed)

## 2016-02-25 NOTE — ED Notes (Signed)
Pt has decreased appetite, last time vomiting on Monday, very strong odor

## 2016-02-25 NOTE — ED Provider Notes (Addendum)
CSN: [id number]     Arrival date & time 02/25/16  1937 History  By signing my name below, I, Craig Terry, attest that this documentation has been prepared under the direction and in the presence of Craig OctaveStephen Rinda Rollyson, MD . Electronically Signed: Marisue HumbleMichelle Terry, Scribe. 02/25/2016. 10:33 PM.   Chief Complaint  Patient presents with  . Emesis   The history is provided by the patient. No language interpreter was used.   HPI Comments:  Craig Terry is a 35 y.o. male with PMHx of Hepatitis C who presents to the Emergency Department complaining of 3 episodes of non-bloody emesis with strong odor yesterday (states "sulfur" smell, denies feculent emesis). Pt reports associated episode of diarrhea today, decreased appetite, lower abdominal cramping, swollen lymph nodes and fatigue. He states he has been feeling better today. Pt reports possible sick contacts at work. He reports past drug use but denies drug use currently. Pt denies current nausea, vomiting today, blood in stool, subjective fever, dysuria, hematuria, testicular pain, sore throat, recent antibiotic use, abdominal surgeries or recent travel outside the country.  Past Medical History  Diagnosis Date  . Hepatitis C    Past Surgical History  Procedure Laterality Date  . Orthopedic surgery     History reviewed. No pertinent family history. Social History  Substance Use Topics  . Smoking status: Current Every Day Smoker  . Smokeless tobacco: None  . Alcohol Use: No    Review of Systems  10 systems reviewed and all are negative for acute change except as noted in the HPI.  Allergies  Review of patient's allergies indicates no known allergies.  Home Medications   Prior to Admission medications   Medication Sig Start Date End Date Taking? Authorizing Provider  LORazepam (ATIVAN) 1 MG tablet Take 1 mg by mouth at bedtime.    Yes Historical Provider, MD  Oxycodone HCl 10 MG TABS Take 10 mg by mouth 4 (four) times daily.    Yes  Historical Provider, MD  nabumetone (RELAFEN) 500 MG tablet Take 1 tablet (500 mg total) by mouth 2 (two) times daily. Patient not taking: Reported on 02/25/2016 01/20/16   Vickki HearingStanley E Harrison, MD  ondansetron (ZOFRAN) 4 MG tablet Take 1 tablet (4 mg total) by mouth every 6 (six) hours. 02/25/16   Craig OctaveStephen Shoshanah Dapper, MD   BP 116/80 mmHg  Pulse 66  Temp(Src) 98 F (36.7 C) (Oral)  Resp 16  Ht 5\' 8"  (1.727 m)  Wt 165 lb (74.844 kg)  BMI 25.09 kg/m2  SpO2 99% Physical Exam  Constitutional: He is oriented to person, place, and time. He appears well-developed and well-nourished. No distress.  HENT:  Head: Normocephalic and atraumatic.  Mouth/Throat: Oropharynx is clear and moist and mucous membranes are normal. No oropharyngeal exudate.  Moist mucous membranes   Eyes: Conjunctivae and EOM are normal. Pupils are equal, round, and reactive to light.  Neck: Normal range of motion. Neck supple.  No meningismus.  Cardiovascular: Normal rate, regular rhythm, normal heart sounds and intact distal pulses.   No murmur heard. Pulmonary/Chest: Effort normal and breath sounds normal. No respiratory distress.  Abdominal: Soft. There is no tenderness. There is no rebound and no guarding.  Musculoskeletal: Normal range of motion. He exhibits no edema or tenderness.  Neurological: He is alert and oriented to person, place, and time. No cranial nerve deficit. He exhibits normal muscle tone. Coordination normal.  No ataxia on finger to nose bilaterally. No pronator drift. 5/5 strength throughout. CN 2-12 intact.Equal grip  strength. Sensation intact.   Skin: Skin is warm.  Multiple tattoos  Psychiatric: He has a normal mood and affect. His behavior is normal.  Nursing note and vitals reviewed.  ED Course  Procedures  DIAGNOSTIC STUDIES:  Oxygen Saturation is 99% on RA, normal by my interpretation.    COORDINATION OF CARE:  10:28 PM Will administer fluids and nausea medication. Will order UA. Discussed  treatment plan with pt at bedside and pt agreed to plan.  Labs Review Labs Reviewed  HEPATIC FUNCTION PANEL - Abnormal; Notable for the following:    AST 143 (*)    ALT 263 (*)    All other components within normal limits  CBC WITH DIFFERENTIAL/PLATELET  BASIC METABOLIC PANEL  LIPASE, BLOOD  URINALYSIS, ROUTINE W REFLEX MICROSCOPIC (NOT AT [facility])    Imaging Review No results found. I have personally reviewed and evaluated these images and lab results as part of my medical decision-making.   EKG Interpretation None      MDM   Final diagnoses:  Nausea vomiting and diarrhea   History of hepatitis C with nausea, vomiting, diarrhea for the past 2 days.  Denies fever. No sick contacts, recent travel or antibiotic use. Last episode of vomiting yesterday.  Vitals stable, abdomen soft, no peritoneal signs.  UA negative. Labs reassuring.  Minimal LFT elevation similar to previous, likely from hepatitis C.  No RUQ pain. Bilirubin normal.  Patient is well-appearing. He is tolerating by mouth in the ED. Abdomen is soft and nonsurgical. We'll treat supportively for possible viral gastrointestinal illness. By mouth hydration, antiemetics, follow-up with PCP, return precautions discussed.  I personally performed the services described in this documentation, which was scribed in my presence. The recorded information has been reviewed and is accurate.  Craig Octave, MD 02/26/16 1030  Craig Octave, MD 02/26/16 1031

## 2016-02-26 NOTE — ED Notes (Signed)
Patient given discharge instruction, verbalized understand. IV removed, band aid applied. Patient ambulatory out of the department.  

## 2016-07-06 ENCOUNTER — Other Ambulatory Visit (HOSPITAL_COMMUNITY)
Admission: RE | Admit: 2016-07-06 | Discharge: 2016-07-06 | Disposition: A | Discharge status: Home / Self Care | Payer: BLUE CROSS/BLUE SHIELD | Source: Ambulatory Visit | Attending: Family Medicine | Admitting: Family Medicine

## 2016-07-06 DIAGNOSIS — K769 Liver disease, unspecified: Secondary | ICD-10-CM | POA: Diagnosis present

## 2016-07-06 DIAGNOSIS — E784 Other hyperlipidemia: Secondary | ICD-10-CM | POA: Diagnosis not present

## 2016-07-06 LAB — LIPID PANEL
CHOL/HDL RATIO: 4 ratio
Cholesterol: 190 mg/dL (ref 0–200)
HDL: 47 mg/dL (ref >40)
LDL CALC: 126 mg/dL — AB (ref 0–99)
TRIGLYCERIDES: 86 mg/dL (ref <150)
VLDL: 17 mg/dL (ref 0–40)

## 2016-07-06 LAB — HEPATIC FUNCTION PANEL
ALBUMIN: 4.6 g/dL (ref 3.5–5.0)
ALT: 264 U/L — ABNORMAL HIGH (ref 17–63)
AST: 187 U/L — AB (ref 15–41)
Alkaline Phosphatase: 76 U/L (ref 38–126)
BILIRUBIN TOTAL: 0.8 mg/dL (ref 0.3–1.2)
Bilirubin, Direct: 0.1 mg/dL (ref 0.1–0.5)
Indirect Bilirubin: 0.7 mg/dL (ref 0.3–0.9)
TOTAL PROTEIN: 7.1 g/dL (ref 6.5–8.1)

## 2016-07-07 LAB — HEPATITIS C ANTIBODY: HCV Ab: >11 s/co ratio — ABNORMAL HIGH (ref 0.0–0.9)

## 2017-08-29 ENCOUNTER — Emergency Department (HOSPITAL_COMMUNITY): Payer: Self-pay

## 2017-08-29 ENCOUNTER — Encounter (HOSPITAL_COMMUNITY): Payer: Self-pay | Admitting: *Deleted

## 2017-08-29 ENCOUNTER — Inpatient Hospital Stay (HOSPITAL_COMMUNITY)
Admission: EM | Admit: 2017-08-29 | Discharge: 2017-08-31 | DRG: 917 | Disposition: A | Discharge status: Home / Self Care | Payer: Self-pay | Attending: Internal Medicine | Admitting: Internal Medicine

## 2017-08-29 DIAGNOSIS — B1920 Unspecified viral hepatitis C without hepatic coma: Secondary | ICD-10-CM | POA: Diagnosis present

## 2017-08-29 DIAGNOSIS — F17200 Nicotine dependence, unspecified, uncomplicated: Secondary | ICD-10-CM | POA: Diagnosis present

## 2017-08-29 DIAGNOSIS — Z653 Problems related to other legal circumstances: Secondary | ICD-10-CM

## 2017-08-29 DIAGNOSIS — Z7151 Drug abuse counseling and surveillance of drug abuser: Secondary | ICD-10-CM

## 2017-08-29 DIAGNOSIS — E860 Dehydration: Secondary | ICD-10-CM | POA: Diagnosis present

## 2017-08-29 DIAGNOSIS — D72829 Elevated white blood cell count, unspecified: Secondary | ICD-10-CM | POA: Diagnosis present

## 2017-08-29 DIAGNOSIS — J9600 Acute respiratory failure, unspecified whether with hypoxia or hypercapnia: Secondary | ICD-10-CM

## 2017-08-29 DIAGNOSIS — J9601 Acute respiratory failure with hypoxia: Secondary | ICD-10-CM

## 2017-08-29 DIAGNOSIS — G92 Toxic encephalopathy: Secondary | ICD-10-CM | POA: Diagnosis present

## 2017-08-29 DIAGNOSIS — T50904A Poisoning by unspecified drugs, medicaments and biological substances, undetermined, initial encounter: Secondary | ICD-10-CM

## 2017-08-29 DIAGNOSIS — Z79891 Long term (current) use of opiate analgesic: Secondary | ICD-10-CM

## 2017-08-29 DIAGNOSIS — T50902A Poisoning by unspecified drugs, medicaments and biological substances, intentional self-harm, initial encounter: Principal | ICD-10-CM | POA: Diagnosis present

## 2017-08-29 DIAGNOSIS — G9340 Encephalopathy, unspecified: Secondary | ICD-10-CM

## 2017-08-29 DIAGNOSIS — Z22322 Carrier or suspected carrier of Methicillin resistant Staphylococcus aureus: Secondary | ICD-10-CM

## 2017-08-29 DIAGNOSIS — Z79899 Other long term (current) drug therapy: Secondary | ICD-10-CM

## 2017-08-29 DIAGNOSIS — N289 Disorder of kidney and ureter, unspecified: Secondary | ICD-10-CM | POA: Diagnosis present

## 2017-08-29 LAB — CBC WITH DIFFERENTIAL/PLATELET
Basophils Absolute: 0 10*3/uL (ref 0.0–0.1)
Basophils Relative: 0 %
EOS ABS: 0.1 10*3/uL (ref 0.0–0.7)
Eosinophils Relative: 1 %
HCT: 40.9 % (ref 39.0–52.0)
HEMOGLOBIN: 14.2 g/dL (ref 13.0–17.0)
Lymphocytes Relative: 11 %
Lymphs Abs: 1.5 10*3/uL (ref 0.7–4.0)
MCH: 31 pg (ref 26.0–34.0)
MCHC: 34.7 g/dL (ref 30.0–36.0)
MCV: 89.3 fL (ref 78.0–100.0)
MONOS PCT: 6 %
Monocytes Absolute: 0.8 10*3/uL (ref 0.1–1.0)
NEUTROS PCT: 82 %
Neutro Abs: 11 10*3/uL — ABNORMAL HIGH (ref 1.7–7.7)
Platelets: 187 10*3/uL (ref 150–400)
RBC: 4.58 MIL/uL (ref 4.22–5.81)
RDW: 12.9 % (ref 11.5–15.5)
WBC: 13.5 10*3/uL — ABNORMAL HIGH (ref 4.0–10.5)

## 2017-08-29 LAB — GLUCOSE, CAPILLARY
GLUCOSE-CAPILLARY: 73 mg/dL (ref 65–99)
GLUCOSE-CAPILLARY: 50 mg/dL — AB (ref 65–99)
GLUCOSE-CAPILLARY: 94 mg/dL (ref 65–99)
Glucose-Capillary: 75 mg/dL (ref 65–99)
Glucose-Capillary: 154 mg/dL — ABNORMAL HIGH (ref 65–99)

## 2017-08-29 LAB — BLOOD GAS, ARTERIAL
Acid-base deficit: 0.9 mmol/L (ref 0.0–2.0)
Bicarbonate: 23.9 mmol/L (ref 20.0–28.0)
Drawn by: 105551
FIO2: 40
O2 SAT: 99.3 %
PATIENT TEMPERATURE: 35.5
PCO2 ART: 34.2 mmHg (ref 32.0–48.0)
PEEP: 5 cmH2O
PH ART: 7.436 (ref 7.350–7.450)
PO2 ART: 193 mmHg — AB (ref 83.0–108.0)
RATE: 14 resp/min
VT: 550 mL

## 2017-08-29 LAB — COMPREHENSIVE METABOLIC PANEL
ALK PHOS: 70 U/L (ref 38–126)
ALT: 79 U/L — ABNORMAL HIGH (ref 17–63)
ANION GAP: 6 (ref 5–15)
AST: 71 U/L — ABNORMAL HIGH (ref 15–41)
Albumin: 4.3 g/dL (ref 3.5–5.0)
BILIRUBIN TOTAL: 1 mg/dL (ref 0.3–1.2)
BUN: 18 mg/dL (ref 6–20)
CALCIUM: 8.9 mg/dL (ref 8.9–10.3)
CO2: 29 mmol/L (ref 22–32)
Chloride: 107 mmol/L (ref 101–111)
Creatinine, Ser: 1.31 mg/dL — ABNORMAL HIGH (ref 0.61–1.24)
Glucose, Bld: 147 mg/dL — ABNORMAL HIGH (ref 65–99)
Potassium: 4.2 mmol/L (ref 3.5–5.1)
SODIUM: 142 mmol/L (ref 135–145)
TOTAL PROTEIN: 6.9 g/dL (ref 6.5–8.1)

## 2017-08-29 LAB — URINALYSIS, ROUTINE W REFLEX MICROSCOPIC
Bilirubin Urine: NEGATIVE
GLUCOSE, UA: NEGATIVE mg/dL
HGB URINE DIPSTICK: NEGATIVE
KETONES UR: NEGATIVE mg/dL
LEUKOCYTES UA: NEGATIVE
Nitrite: NEGATIVE
PH: 5 (ref 5.0–8.0)
PROTEIN: 100 mg/dL — AB
SQUAMOUS EPITHELIAL / LPF: NONE SEEN
Specific Gravity, Urine: 1.027 (ref 1.005–1.030)

## 2017-08-29 LAB — RAPID URINE DRUG SCREEN, HOSP PERFORMED
Amphetamines: POSITIVE — AB
Barbiturates: NOT DETECTED
Benzodiazepines: POSITIVE — AB
COCAINE: POSITIVE — AB
OPIATES: POSITIVE — AB
Tetrahydrocannabinol: POSITIVE — AB

## 2017-08-29 LAB — AMMONIA: Ammonia: 9 umol/L (ref 9–35)

## 2017-08-29 LAB — PROTIME-INR
INR: 1.1
PROTHROMBIN TIME: 14.1 s (ref 11.4–15.2)

## 2017-08-29 LAB — ACETAMINOPHEN LEVEL: Acetaminophen (Tylenol), Serum: <10 ug/mL — ABNORMAL LOW (ref 10–30)

## 2017-08-29 LAB — MRSA PCR SCREENING: MRSA by PCR: POSITIVE — AB

## 2017-08-29 LAB — CK: CK TOTAL: 186 U/L (ref 49–397)

## 2017-08-29 LAB — ETHANOL

## 2017-08-29 LAB — HIV ANTIBODY (ROUTINE TESTING W REFLEX): HIV Screen 4th Generation wRfx: NONREACTIVE

## 2017-08-29 LAB — SALICYLATE LEVEL: Salicylate Lvl: <7 mg/dL (ref 2.8–30.0)

## 2017-08-29 MED ORDER — MIDAZOLAM HCL 2 MG/2ML IJ SOLN: 1.0000 mg | INTRAMUSCULAR | Status: DC | PRN

## 2017-08-29 MED ORDER — SODIUM CHLORIDE 0.9 % IV SOLN
INTRAVENOUS | Status: AC
  Administered 2017-08-29: 07:00:00 via INTRAVENOUS

## 2017-08-29 MED ORDER — SUCCINYLCHOLINE CHLORIDE 20 MG/ML IJ SOLN
INTRAMUSCULAR | Status: DC | PRN
  Administered 2017-08-29: 100 mg via INTRAVENOUS

## 2017-08-29 MED ORDER — PANTOPRAZOLE SODIUM 40 MG IV SOLR
40.0000 mg | Freq: Every day | INTRAVENOUS | Status: DC
  Administered 2017-08-29 – 2017-08-30 (×2): 40 mg via INTRAVENOUS
  Filled 2017-08-29 (×2): qty 40

## 2017-08-29 MED ORDER — CHLORHEXIDINE GLUCONATE CLOTH 2 % EX PADS
6.0000 | MEDICATED_PAD | Freq: Every day | CUTANEOUS | Status: DC
  Administered 2017-08-29 – 2017-08-30 (×2): 6 via TOPICAL

## 2017-08-29 MED ORDER — DEXTROSE 250 MG/ML IV SOLN: 25.0000 mg/kg | Freq: Once | INTRAVENOUS | Status: DC

## 2017-08-29 MED ORDER — MIDAZOLAM HCL 5 MG/5ML IJ SOLN
INTRAMUSCULAR | Status: AC
  Administered 2017-08-29: 5 mg via INTRAVENOUS
  Filled 2017-08-29: qty 5

## 2017-08-29 MED ORDER — MUPIROCIN 2 % EX OINT
1.0000 "application " | TOPICAL_OINTMENT | Freq: Two times a day (BID) | CUTANEOUS | Status: DC
  Administered 2017-08-29 – 2017-08-31 (×4): 1 via NASAL
  Filled 2017-08-29 (×3): qty 22

## 2017-08-29 MED ORDER — SODIUM CHLORIDE 0.9 % IV SOLN: 250.0000 mL | INTRAVENOUS | Status: DC | PRN

## 2017-08-29 MED ORDER — MIDAZOLAM HCL 5 MG/5ML IJ SOLN
5.0000 mg | Freq: Once | INTRAMUSCULAR | Status: AC
  Administered 2017-08-29: 5 mg via INTRAVENOUS

## 2017-08-29 MED ORDER — PROPOFOL 1000 MG/100ML IV EMUL
INTRAVENOUS | Status: DC | PRN
  Administered 2017-08-29: 10 ug/kg/min via INTRAVENOUS

## 2017-08-29 MED ORDER — FENTANYL CITRATE (PF) 100 MCG/2ML IJ SOLN: 25.0000 ug | INTRAMUSCULAR | Status: DC | PRN

## 2017-08-29 MED ORDER — WHITE PETROLATUM GEL
Status: AC
  Administered 2017-08-29: 1
  Filled 2017-08-29: qty 1

## 2017-08-29 MED ORDER — DEXTROSE 250 MG/ML IV SOLN
2.5000 g | Freq: Once | INTRAVENOUS | Status: DC
  Filled 2017-08-29: qty 10

## 2017-08-29 MED ORDER — SODIUM CHLORIDE 0.9 % IV SOLN
INTRAVENOUS | Status: DC
  Administered 2017-08-29 (×2): via INTRAVENOUS

## 2017-08-29 MED ORDER — SODIUM CHLORIDE 0.9 % IV SOLN
0.4000 ug/kg/h | INTRAVENOUS | Status: DC
  Filled 2017-08-29 (×2): qty 2

## 2017-08-29 MED ORDER — DEXTROSE 50 % IV SOLN
25.0000 g | Freq: Once | INTRAVENOUS | Status: DC
  Filled 2017-08-29: qty 50

## 2017-08-29 MED ORDER — PROPOFOL 1000 MG/100ML IV EMUL
5.0000 ug/kg/min | Freq: Once | INTRAVENOUS | Status: DC
  Filled 2017-08-29: qty 100

## 2017-08-29 MED ORDER — DEXTROSE 50 % IV SOLN
INTRAVENOUS | Status: AC
  Administered 2017-08-29: 25 mL
  Filled 2017-08-29: qty 50

## 2017-08-29 MED ORDER — ENOXAPARIN SODIUM 40 MG/0.4ML ~~LOC~~ SOLN
40.0000 mg | SUBCUTANEOUS | Status: DC
Start: 2017-08-29 — End: 2017-09-01
  Administered 2017-08-29: 40 mg via SUBCUTANEOUS
  Filled 2017-08-29 (×3): qty 0.4

## 2017-08-29 MED ORDER — ETOMIDATE 2 MG/ML IV SOLN
INTRAVENOUS | Status: DC | PRN
  Administered 2017-08-29: 20 mg via INTRAVENOUS

## 2017-08-29 MED ORDER — PROPOFOL 1000 MG/100ML IV EMUL
INTRAVENOUS | Status: AC
  Filled 2017-08-29: qty 100

## 2017-08-29 MED ORDER — PROPOFOL 1000 MG/100ML IV EMUL: 5.0000 ug/kg/min | Freq: Once | INTRAVENOUS | Status: DC

## 2017-08-29 MED ORDER — SODIUM CHLORIDE 0.9 % IV BOLUS (SEPSIS)
1000.0000 mL | Freq: Once | INTRAVENOUS | Status: AC
  Administered 2017-08-29: 1000 mL via INTRAVENOUS

## 2017-08-29 NOTE — Procedures (Signed)
Extubation Procedure Note  Patient Details:   Name: Craig Terry DOB: Jan 30, 1981 MRN: [id number]   Airway Documentation:  Airway 7.5 mm (Active)  Secured at (cm) 24 cm 08/29/2017  8:13 AM  Measured From Lips 08/29/2017  8:13 AM  Secured Location Center 08/29/2017  8:13 AM  Secured By [employer] 08/29/2017  8:13 AM  Tube Holder Repositioned Yes 08/29/2017  8:13 AM  Cuff Pressure (cm H2O) 22 cm H2O 08/29/2017  8:13 AM  Site Condition Dry 08/29/2017  8:13 AM    Evaluation  O2 sats: stable throughout Complications: No apparent complications Patient did tolerate procedure well. Bilateral Breath Sounds: Clear   Yes   Pt extubated per MD order.  Pt placed on 4L [place].  Sats 99%.  RN and MD at bedside.  RT will continue to monitor. Ronny Flurry 08/29/2017, 9:02 AM

## 2017-08-29 NOTE — H&P (Signed)
PULMONARY / CRITICAL CARE MEDICINE   Name: Craig Terry MRN: [id number] DOB: 1981/08/17    ADMISSION DATE:  08/29/2017 CONSULTATION DATE:  08/29/17  REFERRING MD:  Dr Manus Gunning, [facility] ED  CHIEF COMPLAINT:  Obtundation   HISTORY OF PRESENT ILLNESS:   36 year old man with a history of hepatitis C, brought to the [facility] emergency department unresponsive after argument with his girlfriend. He received Narcan and then became combative. Subsequent course characterized by intermittent periods of agitation and then hypersomnolence and apnea. He was intubated for airway protection. Urine drug screen positive for opiates, cocaine, benzos, amphetamines, THC. Ammonia normal. Tylenol, alcohol and salicylate levels negative. Transferred to the ICU for further care.  PAST MEDICAL HISTORY :  He  has a past medical history of Hepatitis C.  PAST SURGICAL HISTORY: He  has a past surgical history that includes orthopedic surgery.  No Known Allergies  No current facility-administered medications on file prior to encounter.    Current Outpatient Prescriptions on File Prior to Encounter  Medication Sig  . LORazepam (ATIVAN) 1 MG tablet Take 1 mg by mouth at bedtime.   . nabumetone (RELAFEN) 500 MG tablet Take 1 tablet (500 mg total) by mouth 2 (two) times daily. (Patient not taking: Reported on 02/25/2016)  . ondansetron (ZOFRAN) 4 MG tablet Take 1 tablet (4 mg total) by mouth every 6 (six) hours.  . Oxycodone HCl 10 MG TABS Take 10 mg by mouth 4 (four) times daily.     FAMILY HISTORY:  His has no family status information on file.    SOCIAL HISTORY: He  reports that he has been smoking.  He does not have any smokeless tobacco history on file. He reports that he uses drugs, including Marijuana. He reports that he does not drink alcohol.  REVIEW OF SYSTEMS:   Unable to obtain  SUBJECTIVE:  Unable to obtain any information the patient Currently on propofol for sedation, mechanical ventilation with  comfortable respiratory pattern.  VITAL SIGNS: BP 102/73   Pulse 90   Temp 97.7 F (36.5 C)   Resp 14   Ht  (1.803 m)   Wt 66.5 kg (146 lb 9.7 oz)   SpO2 100%   BMI 20.45 kg/m   HEMODYNAMICS:    VENTILATOR SETTINGS: Vent Mode: PRVC FiO2 (%):  [40 %] 40 % Set Rate:  [14 bmp] 14 bmp Vt Set:  [550 mL-600 mL] 600 mL PEEP:  [5 cmH20] 5 cmH20 Plateau Pressure:  [14 cmH20] 14 cmH20  INTAKE / OUTPUT: I/O last 3 completed shifts: In: 1000 [IV Piggyback:1000] Out: 600 [Urine:600]  PHYSICAL EXAMINATION: General:  Well-appearing young man, intubated, sedated Neuro:  Unresponsive on propofol 50, grimace with pain, no response to voice. Unable to follow commands HEENT:  ET tube in place, pupils equal and reactive Cardiovascular:  Regular, no murmurs Lungs:  Clear bilaterally, no wheezing Abdomen:  Soft, nontender, hypoactive bowel sounds Musculoskeletal:  Bruising on his right shin with a healing abrasion 0.5 cm Skin:  No rash, no evidence of needle sticks or track marks  LABS:  BMET  Recent Labs Lab 08/29/17 0218  NA 142  K 4.2  CL 107  CO2 29  BUN 18  CREATININE 1.31*  GLUCOSE 147*    Electrolytes  Recent Labs Lab 08/29/17 0218  CALCIUM 8.9    CBC  Recent Labs Lab 08/29/17 0218  WBC 13.5*  HGB 14.2  HCT 40.9  PLT 187    Coag's No results for input(s): APTT,  INR in the last 168 hours.  Sepsis Markers No results for input(s): LATICACIDVEN, PROCALCITON, O2SATVEN in the last 168 hours.  ABG  Recent Labs Lab 08/29/17 0305  PHART 7.436  PCO2ART 34.2  PO2ART 193.0*    Liver Enzymes  Recent Labs Lab 08/29/17 0218  AST 71*  ALT 79*  ALKPHOS 70  BILITOT 1.0  ALBUMIN 4.3    Cardiac Enzymes No results for input(s): TROPONINI, PROBNP in the last 168 hours.  Glucose No results for input(s): GLUCAP in the last 168 hours.  Imaging Ct Head Wo Contrast  Result Date: 08/29/2017 CLINICAL DATA:  Altered level of consciousness. EXAM:  CT HEAD WITHOUT CONTRAST TECHNIQUE: Contiguous axial images were obtained from the base of the skull through the vertex without intravenous contrast. COMPARISON:  None. FINDINGS: Brain: Despite repeat imaging there is moderate motion artifact. Allowing for this no evidence of acute hemorrhage or ischemia. No hydrocephalus, the basilar cisterns are patent. No subdural or extra-axial fluid collection. Vascular: No hyperdense vessel. Skull: Negative for fracture allowing for motion. Vertex not included in the field of view. Sinuses/Orbits: Scattered ethmoid mucosal thickening. No fluid levels. The mastoid air cells are clear. No acute orbital abnormality. Other: None. IMPRESSION: No acute intracranial abnormality, allowing for motion artifact. Electronically Signed   By: Rubye Oaks M.D.   On: 08/29/2017 03:25   Dg Chest Portable 1 View  Result Date: 08/29/2017 CLINICAL DATA:  36 y/o  M; endotracheal tube. EXAM: PORTABLE CHEST 1 VIEW COMPARISON:  None. FINDINGS: Endotracheal tube 6.3 cm from the carina. Enteric tube tip below the field of view in the abdomen. Normal cardiac silhouette. Clear lungs. No pleural effusion or pneumothorax. Bones are unremarkable. IMPRESSION: Endotracheal tube tip 6.3 cm from carina, enteric tube tip below the field of view in the abdomen. No acute cardiopulmonary process identified. Electronically Signed   By: Mitzi Hansen M.D.   On: 08/29/2017 03:04     STUDIES:  Head CT 9/30 >> motion artifact but no obvious acute hemorrhage or ischemia, scattered ethmoid mucosal thickening  CULTURES: MRSA PCR 9/30 >>   ANTIBIOTICS: none  SIGNIFICANT EVENTS: 9/30 Transferred to [facility] ICU intubated  LINES/TUBES: ETT 9/30 >>   DISCUSSION: 36 year old man with hep C, intubated and ventilated due to obtundation in the setting of polysubstance ingestion (cocaine, narcotics, benzos, amphetamines).   ASSESSMENT / PLAN:  PULMONARY A: Acute respiratory failure,  ventilated for obtundation and airway protection P:   Vent support pending MS improvement, PRVC 8cc/kg Follow chest x-ray if he remains intubated  CARDIOVASCULAR A:  No active issues P:  Follow telemetry  RENAL A:   Acute nonoliguric renal insufficiency P:   Ensure adequate renal perfusion Follow BMP, urine output Follow CPK, downtime was minimal so low risk for rhabdo Evaluate further if not resolving with conservative management  GASTROINTESTINAL A:   History hepatitis C Mild transaminitis P:   Follow LFT Check coags and quantitative hep C RNA PPI for SUP  HEMATOLOGIC A:   Leukocytosis in absence of any evidence for active infection P:  Follow CBC DVD prophylaxis with enoxaparin  INFECTIOUS A:   No evidence for active infection P:   Follow clinically, WBC, fever curve  ENDOCRINE A:   No acute issues P:    NEUROLOGIC A:   Encephalopathy, obtundation due to substance ingestion. Ammonia normal P:   RASS goal: 0 to -1 Currently on propofol. Plan to lighten on 9/30 assess for possible SPT   FAMILY  - Updates: No  family at bedside 9/30  - Inter-disciplinary family meet or Palliative Care meeting due by:  10/6  Independent CC time 45 minutes  Levy Pupa, MD, PhD 08/29/2017, 8:26 AM [facility] [phone] or if no answer [phone]

## 2017-08-29 NOTE — ED Provider Notes (Signed)
[facility] DEPT Provider Note   CSN: [id number] Arrival date & time: 08/29/17  0154     History   Chief Complaint Chief Complaint  Patient presents with  . Drug Overdose    HPI Craig Terry is a 36 y.o. male.  Level 5 caveat for patient's combativeness and uncooperation. Brought in by EMS after being found unresponsive in the bathroom after arguing with his girlfriend. Suspect overdose though no needles on the scene. Patient received 2 mg of IM Narcan and 3 mg of intranasal Narcan and became combative. He's having intermittent periods of apnea as well as periods of agitation. No vomiting. Unable to give any history moving all extremities. No evidence of significant trauma.   The history is provided by the patient and the EMS personnel. The history is limited by the condition of the patient.  Drug Overdose     Past Medical History:  Diagnosis Date  . Hepatitis C     There are no active problems to display for this patient.   Past Surgical History:  Procedure Laterality Date  . ORTHOPEDIC SURGERY         Home Medications    Prior to Admission medications   Medication Sig Start Date End Date Taking? Authorizing Provider  LORazepam (ATIVAN) 1 MG tablet Take 1 mg by mouth at bedtime.     [provider]  nabumetone (RELAFEN) 500 MG tablet Take 1 tablet (500 mg total) by mouth 2 (two) times daily. Patient not taking: Reported on 02/25/2016 01/20/16   Carole Civil, MD  ondansetron (ZOFRAN) 4 MG tablet Take 1 tablet (4 mg total) by mouth every 6 (six) hours. 02/25/16   Jaquese Irving, Annie Main, MD  Oxycodone HCl 10 MG TABS Take 10 mg by mouth 4 (four) times daily.     [provider]    Family History No family history on file.  Social History Social History  Substance Use Topics  . Smoking status: Current Every Day Smoker  . Smokeless tobacco: Not on file  . Alcohol use No     Allergies   Patient has no known allergies.   Review of  Systems Review of Systems  Unable to perform ROS: Mental status change     Physical Exam Updated Vital Signs BP 103/75   Pulse 89   Temp (!) 95.7 F (35.4 C)   Resp 14   Ht _0  (1.803 m)   Wt 77.1 kg (170 lb)   SpO2 100%   BMI 23.71 kg/m   Physical Exam  Constitutional: He is oriented to person, place, and time. He appears well-developed and well-nourished. He appears distressed.  Patient is agitated, screaming, not following commands. Intermittent periods of apnea requiring assisted ventilations  HENT:  Head: Normocephalic and atraumatic.  Mouth/Throat: Oropharynx is clear and moist. No oropharyngeal exudate.  Eyes: Pupils are equal, round, and reactive to light. Conjunctivae and EOM are normal.  Neck: Normal range of motion. Neck supple.  No meningismus.  Cardiovascular: Normal rate, regular rhythm, normal heart sounds and intact distal pulses.   No murmur heard. Pulmonary/Chest: Effort normal and breath sounds normal. No respiratory distress.  Abrasions to chest  Abdominal: Soft. There is no tenderness. There is no rebound and no guarding.  Musculoskeletal: Normal range of motion. He exhibits no edema or tenderness.  Neurological: He is alert and oriented to person, place, and time. No cranial nerve deficit. He exhibits normal muscle tone. Coordination normal.  Patient uncooperative, moving all extremities, does not  follow commands or answer questions  Skin: Skin is warm.  Psychiatric: He has a normal mood and affect. His behavior is normal.  Nursing note and vitals reviewed.    ED Treatments / Results  Labs (all labs ordered are listed, but only abnormal results are displayed) Labs Reviewed  CBC WITH DIFFERENTIAL/PLATELET - Abnormal; Notable for the following:       Result Value   WBC 13.5 (*)    Neutro Abs 11.0 (*)    All other components within normal limits  COMPREHENSIVE METABOLIC PANEL - Abnormal; Notable for the following:    Glucose, Bld 147 (*)     Creatinine, Ser 1.31 (*)    AST 71 (*)    ALT 79 (*)    All other components within normal limits  RAPID URINE DRUG SCREEN, HOSP PERFORMED - Abnormal; Notable for the following:    Opiates POSITIVE (*)    Cocaine POSITIVE (*)    Benzodiazepines POSITIVE (*)    Amphetamines POSITIVE (*)    Tetrahydrocannabinol POSITIVE (*)    All other components within normal limits  URINALYSIS, ROUTINE W REFLEX MICROSCOPIC - Abnormal; Notable for the following:    APPearance HAZY (*)    Protein, ur 100 (*)    Bacteria, UA RARE (*)    All other components within normal limits  ACETAMINOPHEN LEVEL - Abnormal; Notable for the following:    Acetaminophen (Tylenol), Serum <10 (*)    All other components within normal limits  BLOOD GAS, ARTERIAL - Abnormal; Notable for the following:    pO2, Arterial 193.0 (*)    All other components within normal limits  ETHANOL  SALICYLATE LEVEL  AMMONIA  CK    EKG  EKG Interpretation  Date/Time:  Sunday August 29 2017 02:32:20 EDT Ventricular Rate:  87 PR Interval:    QRS Duration: 90 QT Interval:  379 QTC Calculation: 456 R Axis:   83 Text Interpretation:  Sinus rhythm RSR' in V1 or V2, probably normal variant No previous ECGs available Confirmed by Ezequiel Essex [phone]) on 08/29/2017 2:38:37 AM       Radiology Ct Head Wo Contrast  Result Date: 08/29/2017 CLINICAL DATA:  Altered level of consciousness. EXAM: CT HEAD WITHOUT CONTRAST TECHNIQUE: Contiguous axial images were obtained from the base of the skull through the vertex without intravenous contrast. COMPARISON:  None. FINDINGS: Brain: Despite repeat imaging there is moderate motion artifact. Allowing for this no evidence of acute hemorrhage or ischemia. No hydrocephalus, the basilar cisterns are patent. No subdural or extra-axial fluid collection. Vascular: No hyperdense vessel. Skull: Negative for fracture allowing for motion. Vertex not included in the field of view. Sinuses/Orbits: Scattered  ethmoid mucosal thickening. No fluid levels. The mastoid air cells are clear. No acute orbital abnormality. Other: None. IMPRESSION: No acute intracranial abnormality, allowing for motion artifact. Electronically Signed   By: Jeb Levering M.D.   On: 08/29/2017 03:25   Dg Chest Portable 1 View  Result Date: 08/29/2017 CLINICAL DATA:  36 y/o  M; endotracheal tube. EXAM: PORTABLE CHEST 1 VIEW COMPARISON:  None. FINDINGS: Endotracheal tube 6.3 cm from the carina. Enteric tube tip below the field of view in the abdomen. Normal cardiac silhouette. Clear lungs. No pleural effusion or pneumothorax. Bones are unremarkable. IMPRESSION: Endotracheal tube tip 6.3 cm from carina, enteric tube tip below the field of view in the abdomen. No acute cardiopulmonary process identified. Electronically Signed   By: Kristine Garbe M.D.   On: 08/29/2017 03:04  Procedures Procedure Name: Intubation Date/Time: 08/29/2017 2:20 AM Performed by: Ezequiel Essex Pre-anesthesia Checklist: Patient identified, Patient being monitored, Emergency Drugs available, Timeout performed and Suction available Oxygen Delivery Method: Non-rebreather mask Preoxygenation: Pre-oxygenation with 100% oxygen Induction Type: Rapid sequence Ventilation: Mask ventilation without difficulty Laryngoscope Size: Mac and 3 Grade View: Grade I Tube size: 7.5 mm Number of attempts: 1 Airway Equipment and Method: Video-laryngoscopy and Rigid stylet Placement Confirmation: ETT inserted through vocal cords under direct vision,  CO2 detector and Breath sounds checked- equal and bilateral Secured at: 24 cm Tube secured with: ETT holder Dental Injury: Teeth and Oropharynx as per pre-operative assessment  Future Recommendations: Recommend- induction with short-acting agent, and alternative techniques readily available      (including critical care time)  Medications Ordered in ED Medications  propofol (DIPRIVAN) 1000 MG/100ML  infusion (10 mcg/kg/min  77.1 kg Intravenous New Bag/Given 08/29/17 0207)  propofol (DIPRIVAN) 1000 MG/100ML infusion (20 mcg/kg/min  77.1 kg Intravenous Rate/Dose Change 08/29/17 0213)     Initial Impression / Assessment and Plan / ED Course  I have reviewed the triage vital signs and the nursing notes.  Pertinent labs & imaging results that were available during my care of the patient were reviewed by me and considered in my medical decision making (see chart for details).    Suspect overdose, now agitated and uncooperative after narcan. Periods of apnea.  Plan intubation to secure airway.  Patient intubated as above, given his periods of apnea. Vital signs are reassuring.  Labs obtained, patient given IV fluids. EKG normal sinus rhythm with normal QT. Mild AKI noted with mild leukocytosis. CT head negative.  ABG is reassuring with normal pH and PCO2. Drug screen is positive for multiple substances. Ethanol, Tylenol and salicylate are negative. BP has remained stable throughout ED course.  Dr. Luan Pulling is not available at [facility]. Patient will require transfer to [facility] for ventilator management. Discussed with Dr. Ashok Cordia.  CRITICAL CARE Performed by: Ezequiel Essex Total critical care time: 45 minutes Critical care time was exclusive of separately billable procedures and treating other patients. Critical care was necessary to treat or prevent imminent or life-threatening deterioration. Critical care was time spent personally by me on the following activities: development of treatment plan with patient and/or surrogate as well as nursing, discussions with consultants, evaluation of patient's response to treatment, examination of patient, obtaining history from patient or surrogate, ordering and performing treatments and interventions, ordering and review of laboratory studies, ordering and review of radiographic studies, pulse oximetry and re-evaluation of patient's  condition.     Final Clinical Impressions(s) / ED Diagnoses   Final diagnoses:  Acute respiratory failure with hypoxia ([place])  Intentional drug overdose, initial encounter [facility])    New Prescriptions New Prescriptions   No medications on file     Ezequiel Essex, MD 08/29/17 [phone]

## 2017-08-29 NOTE — ED Notes (Signed)
Report given to Randy with [facility].  

## 2017-08-29 NOTE — Progress Notes (Signed)
Patient arrived via [facility] and placed on vent per protocol.

## 2017-08-29 NOTE — ED Notes (Addendum)
Craig Terry, pt's wife here to see pt; wife left her home and cell numbers [phone] home [phone] cell  Pt's belongings given to [facility]. Pt's wife

## 2017-08-29 NOTE — ED Triage Notes (Signed)
Pt brought in by [facility] for c/o unresponsiveness after pt went into the bathroom a]fter arguing with his girlfriend.pt was given a total of 5 mg narcan with pt becoming combative while enroute to er with ems

## 2017-08-29 NOTE — ED Notes (Signed)
Pt returned from ct,  

## 2017-08-29 NOTE — Progress Notes (Signed)
Hypoglycemic Event  CBG: 50  Treatment: D50 IV 25 mL  Symptoms: None  Follow-up CBG: Time:1643 CBG Result: 94   Possible Reasons for Event: Inadequate meal intake  Comments/MD notified:yes   08/29/17 Bonna Gains RN [phone]

## 2017-08-29 NOTE — ED Notes (Signed)
Dr Manus Gunning at bedside speaking with his wife,

## 2017-08-29 NOTE — ED Notes (Signed)
Patient transported to CT 

## 2017-08-30 ENCOUNTER — Encounter (HOSPITAL_COMMUNITY): Payer: Self-pay | Admitting: *Deleted

## 2017-08-30 DIAGNOSIS — J9601 Acute respiratory failure with hypoxia: Secondary | ICD-10-CM

## 2017-08-30 DIAGNOSIS — T50902A Poisoning by unspecified drugs, medicaments and biological substances, intentional self-harm, initial encounter: Principal | ICD-10-CM

## 2017-08-30 LAB — GLUCOSE, CAPILLARY
GLUCOSE-CAPILLARY: 146 mg/dL — AB (ref 65–99)
GLUCOSE-CAPILLARY: 125 mg/dL — AB (ref 65–99)
Glucose-Capillary: 106 mg/dL — ABNORMAL HIGH (ref 65–99)

## 2017-08-30 LAB — BASIC METABOLIC PANEL
ANION GAP: 6 (ref 5–15)
BUN: 10 mg/dL (ref 6–20)
CO2: 23 mmol/L (ref 22–32)
Calcium: 8 mg/dL — ABNORMAL LOW (ref 8.9–10.3)
Chloride: 108 mmol/L (ref 101–111)
Creatinine, Ser: 0.89 mg/dL (ref 0.61–1.24)
GFR calc Af Amer: >60 mL/min (ref >60)
Glucose, Bld: 117 mg/dL — ABNORMAL HIGH (ref 65–99)
POTASSIUM: 3.7 mmol/L (ref 3.5–5.1)
SODIUM: 137 mmol/L (ref 135–145)

## 2017-08-30 LAB — MAGNESIUM: MAGNESIUM: 2 mg/dL (ref 1.7–2.4)

## 2017-08-30 LAB — HCV RNA QUANT
HCV Quantitative Log: 6.246 log10 IU/mL (ref >1.70)
HCV Quantitative: 1760000 IU/mL (ref >50)

## 2017-08-30 MED ORDER — POTASSIUM CHLORIDE CRYS ER 20 MEQ PO TBCR
20.0000 meq | EXTENDED_RELEASE_TABLET | Freq: Two times a day (BID) | ORAL | Status: AC
  Administered 2017-08-30 (×2): 20 meq via ORAL
  Filled 2017-08-30 (×2): qty 1

## 2017-08-30 MED ORDER — ACETAMINOPHEN 325 MG PO TABS
650.0000 mg | ORAL_TABLET | Freq: Four times a day (QID) | ORAL | Status: DC | PRN
  Administered 2017-08-30: 650 mg via ORAL
  Filled 2017-08-30: qty 2
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
## 2017-08-31 DIAGNOSIS — Z630 Problems in relationship with spouse or partner: Secondary | ICD-10-CM

## 2017-08-31 DIAGNOSIS — F1920 Other psychoactive substance dependence, uncomplicated: Secondary | ICD-10-CM

## 2017-08-31 DIAGNOSIS — F1290 Cannabis use, unspecified, uncomplicated: Secondary | ICD-10-CM

## 2017-08-31 DIAGNOSIS — F17210 Nicotine dependence, cigarettes, uncomplicated: Secondary | ICD-10-CM

## 2017-08-31 LAB — BASIC METABOLIC PANEL
Anion gap: 6 (ref 5–15)
BUN: 6 mg/dL (ref 6–20)
CALCIUM: 8.5 mg/dL — AB (ref 8.9–10.3)
CO2: 26 mmol/L (ref 22–32)
CREATININE: 0.82 mg/dL (ref 0.61–1.24)
Chloride: 107 mmol/L (ref 101–111)
GFR calc Af Amer: >60 mL/min (ref >60)
GLUCOSE: 101 mg/dL — AB (ref 65–99)
Potassium: 3.9 mmol/L (ref 3.5–5.1)
SODIUM: 139 mmol/L (ref 135–145)

## 2017-08-31 LAB — HEPATIC FUNCTION PANEL
ALT: 56 U/L (ref 17–63)
AST: 38 U/L (ref 15–41)
Albumin: 3.1 g/dL — ABNORMAL LOW (ref 3.5–5.0)
Alkaline Phosphatase: 73 U/L (ref 38–126)
Bilirubin, Direct: <0.1 mg/dL — ABNORMAL LOW (ref 0.1–0.5)
TOTAL PROTEIN: 5.5 g/dL — AB (ref 6.5–8.1)
Total Bilirubin: 0.6 mg/dL (ref 0.3–1.2)

## 2017-08-31 LAB — CBC
HCT: 39.4 % (ref 39.0–52.0)
Hemoglobin: 13.6 g/dL (ref 13.0–17.0)
MCH: 30.6 pg (ref 26.0–34.0)
MCHC: 34.5 g/dL (ref 30.0–36.0)
MCV: 88.7 fL (ref 78.0–100.0)
PLATELETS: 176 10*3/uL (ref 150–400)
RBC: 4.44 MIL/uL (ref 4.22–5.81)
RDW: 13.2 % (ref 11.5–15.5)
WBC: 8.5 10*3/uL (ref 4.0–10.5)

## 2017-08-31 LAB — PHOSPHORUS: PHOSPHORUS: 2.6 mg/dL (ref 2.5–4.6)

## 2017-08-31 LAB — MAGNESIUM: Magnesium: 1.9 mg/dL (ref 1.7–2.4)

## 2017-08-31 MED ORDER — PANTOPRAZOLE SODIUM 40 MG PO TBEC: 40.0000 mg | DELAYED_RELEASE_TABLET | Freq: Every day | ORAL | 1 refills | Status: DC

## 2017-08-31 MED ORDER — PANTOPRAZOLE SODIUM 40 MG PO TBEC: 40.0000 mg | DELAYED_RELEASE_TABLET | Freq: Every day | ORAL | 1 refills | Status: AC

## 2017-08-31 MED ORDER — PANTOPRAZOLE SODIUM 40 MG PO TBEC: 40.0000 mg | DELAYED_RELEASE_TABLET | Freq: Every day | ORAL | Status: DC

## 2017-08-31 NOTE — Care Management Note (Signed)
Case Management Note  Patient Details  Name: Craig Terry MRN: [id number] Date of Birth: 03-01-1981  Subjective/Objective:                    Action/Plan: Pt discharging home with self care. CM provided him a discount card for assistance with his discharge medication. Pt has transportation home.   Expected Discharge Date:  08/31/17               Expected Discharge Plan:  Home/Self Care  In-House Referral:  Clinical Social Work  Discharge planning Services  CM Consult, Medication Assistance  Post Acute Care Choice:    Choice offered to:     DME Arranged:    DME Agency:     HH Arranged:    HH Agency:     Status of Service:  Completed, signed off  If discussed at [employer], dates discussed:    Additional Comments:  Kermit Balo, RN 08/31/2017, 4:34 PM

## 2017-08-31 NOTE — Progress Notes (Signed)
Pt discharged with family at 10pm . Pt refused to be wheel out.
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
## 2017-08-31 NOTE — Progress Notes (Signed)
[facility]                                                                              Patient Demographics  Craig Terry, is a 36 y.o. male, DOB - 11/26/1981, ZOX:[id number]  Admit date - 08/29/2017   Admitting Physician Roslynn Amble, MD  Outpatient Primary MD for the patient is Oval Linsey, MD  Outpatient specialists:   LOS - 2  days   Medical records reviewed and are as summarized below:    Chief Complaint  Patient presents with  . Drug Overdose       Brief summary   Per admit note by PCCM  36 year old man with a history of hepatitis C, Presented to [facility] emergency department 9/30 unresponsive after argument with his girlfriend. He received Narcan and then became combative. Subsequent course characterized by intermittent periods of agitation and then hypersomnolence and apnea. He was intubated for airway protection. Urine drug screen positive for opiates, cocaine, benzos, amphetamines, THC. Ammonia normal. Tylenol, alcohol and salicylate levels negative. Transferred to the ICU for further care. Patient now transferred to hospitalist service on 10/2  Assessment & Plan    Principal Problem:   Acute respiratory failure [facility]) - Patient was intubated in ED for airway protection - Currently resolved, he was extubated on 9/30 - O2 sats 100% on room air  Active Problems:   Acute metabolic encephalopathy: Obtunded in ED - Currently resolved, alert and oriented    Intentional drug overdose (HCC), polysubstance abuse - Urine drug screen positive for opiates, cocaine, benzos, amphetamines, THC - Psychiatry consulted, called today, will need clearance for disposition, currently medically stable  Mild acute renal insufficiency - Presented with creatinine of 1.3, likely due to drug overdose and dehydration - Resolved with IV fluid hydration, creatinine 0.82  Mild transaminitis with history of hepatitis C - Liver function test improved  Code  Status: Full CODE STATUS DVT Prophylaxis:  Lovenox Family Communication: Discussed in detail with the patient, all imaging results, lab results explained to the patient   Disposition Plan: Needs psych clearance  Time Spent in minutes   25 minutes  Procedures:  Intubation, extubation  Consultants:   Patient was admitted by CCM Psychiatry called today  Antimicrobials:      Medications  Scheduled Meds: . Chlorhexidine Gluconate Cloth  6 each Topical Q0600  . enoxaparin (LOVENOX) injection  40 mg Subcutaneous Q24H  . mupirocin ointment  1 application Nasal BID  . pantoprazole (PROTONIX) IV  40 mg Intravenous QHS   Continuous Infusions: PRN Meds:.acetaminophen   Antibiotics   Anti-infectives    None        Subjective:   Craig Terry was seen and examined today. Denies any specific complaints.  Patient denies dizziness, chest pain, shortness of breath, abdominal pain, N/V/D/C, new weakness, numbess, tingling. No acute events overnight.    Objective:   Vitals:   08/30/17 2040 08/31/17 0125 08/31/17 0528 08/31/17 1002  BP: 124/84 121/78 120/78 119/77  Pulse: 87 67 80 86  Resp: Temp: 98.5 F (36.9 C) 98.4 F (36.9 C) 98.8 F (37.1 C) 97.8  F (36.6 C)  TempSrc: Oral Oral Oral Oral  SpO2: 99% 99% 100% 100%  Weight: 73.2 kg (161 lb 6.4 oz)     Height:  (1.727 m)      No intake or output data in the 24 hours ending 08/31/17 1016   Wt Readings from Last 3 Encounters:  08/30/17 73.2 kg (161 lb 6.4 oz)  02/25/16 74.8 kg (165 lb)  01/20/16 73 kg (161 lb)     Exam  General: Alert and oriented x 3, NAD  Eyes:   HEENT:  Atraumatic, normocephalic, normal oropharynx  Cardiovascular: S1 S2 auscultated, Regular rate and rhythm.  Respiratory: Clear to auscultation bilaterally, no wheezing, rales or rhonchi  Gastrointestinal: Soft, nontender, nondistended, + bowel sounds  Ext: no pedal edema bilaterally  Neuro: AAOx3, Cr N's II- XII.  Strength 5/5 upper and lower extremities bilaterally, speech clear  Musculoskeletal: No digital cyanosis, clubbing  Skin: No rashes  Psych: Normal affect and demeanor, alert and oriented x3    Data Reviewed:  I have personally reviewed following labs and imaging studies  Micro Results Recent Results (from the past 240 hour(s))  MRSA PCR Screening     Status: Abnormal   Collection Time: 08/29/17  6:34 AM  Result Value Ref Range Status   MRSA by PCR POSITIVE (A) NEGATIVE Final    Comment:        The GeneXpert MRSA Assay (FDA approved for NASAL specimens only), is one component of a comprehensive MRSA colonization surveillance program. It is not intended to diagnose MRSA infection nor to guide or monitor treatment for MRSA infections. RESULT CALLED TO, READ BACK BY AND VERIFIED WITH: RN Kirby Funk [id number] [phone] MLM     Radiology Reports Ct Head Wo Contrast  Result Date: 08/29/2017 CLINICAL DATA:  Altered level of consciousness. EXAM: CT HEAD WITHOUT CONTRAST TECHNIQUE: Contiguous axial images were obtained from the base of the skull through the vertex without intravenous contrast. COMPARISON:  None. FINDINGS: Brain: Despite repeat imaging there is moderate motion artifact. Allowing for this no evidence of acute hemorrhage or ischemia. No hydrocephalus, the basilar cisterns are patent. No subdural or extra-axial fluid collection. Vascular: No hyperdense vessel. Skull: Negative for fracture allowing for motion. Vertex not included in the field of view. Sinuses/Orbits: Scattered ethmoid mucosal thickening. No fluid levels. The mastoid air cells are clear. No acute orbital abnormality. Other: None. IMPRESSION: No acute intracranial abnormality, allowing for motion artifact. Electronically Signed   By: Rubye Oaks M.D.   On: 08/29/2017 03:25   Dg Chest Portable 1 View  Result Date: 08/29/2017 CLINICAL DATA:  36 y/o  M; endotracheal tube. EXAM: PORTABLE CHEST 1 VIEW COMPARISON:  None.  FINDINGS: Endotracheal tube 6.3 cm from the carina. Enteric tube tip below the field of view in the abdomen. Normal cardiac silhouette. Clear lungs. No pleural effusion or pneumothorax. Bones are unremarkable. IMPRESSION: Endotracheal tube tip 6.3 cm from carina, enteric tube tip below the field of view in the abdomen. No acute cardiopulmonary process identified. Electronically Signed   By: Mitzi Hansen M.D.   On: 08/29/2017 03:04    Lab Data:  CBC:  Recent Labs Lab 08/29/17 0218 08/31/17 0503  WBC 13.5* 8.5  NEUTROABS 11.0*  --   HGB 14.2 13.6  HCT 40.9 39.4  MCV 89.3 88.7  PLT 187 176   Basic Metabolic Panel:  Recent Labs Lab 08/29/17 0218 08/30/17 0227 08/31/17 0503  NA 142 137 139  K 4.2 3.7 3.9  CL 107 108 107  CO2 GLUCOSE 147* 117* 101*  BUN CREATININE 1.31* 0.89 0.82  CALCIUM 8.9 8.0* 8.5*  MG  --  2.0 1.9  PHOS  --   --  2.6   GFR: Estimated Creatinine Clearance: 120.5 mL/min (by C-G formula based on SCr of 0.82 mg/dL). Liver Function Tests:  Recent Labs Lab 08/29/17 0218 08/31/17 0503  AST 71* 38  ALT 79* 56  ALKPHOS 70 73  BILITOT 1.0 0.6  PROT 6.9 5.5*  ALBUMIN 4.3 3.1*   No results for input(s): LIPASE, AMYLASE in the last 168 hours.  Recent Labs Lab 08/29/17 0218  AMMONIA 9   Coagulation Profile:  Recent Labs Lab 08/29/17 0858  INR 1.10   Cardiac Enzymes:  Recent Labs Lab 08/29/17 0218  CKTOTAL 186   BNP (last 3 results) No results for input(s): PROBNP in the last 8760 hours. HbA1C: No results for input(s): HGBA1C in the last 72 hours. CBG:  Recent Labs Lab 08/29/17 1718 08/29/17 2024 08/30/17 0029 08/30/17 0716 08/30/17 1114  GLUCAP 94 154* 106* 146* 125*   Lipid Profile: No results for input(s): CHOL, HDL, LDLCALC, TRIG, CHOLHDL, LDLDIRECT in the last 72 hours. Thyroid Function Tests: No results for input(s): TSH, T4TOTAL, FREET4, T3FREE, THYROIDAB in the last 72 hours. Anemia  Panel: No results for input(s): VITAMINB12, FOLATE, FERRITIN, TIBC, IRON, RETICCTPCT in the last 72 hours. Urine analysis:    Component Value Date/Time   COLORURINE YELLOW 08/29/2017 0222   APPEARANCEUR HAZY (A) 08/29/2017 0222   LABSPEC 1.027 08/29/2017 0222   PHURINE 5.0 08/29/2017 0222   GLUCOSEU NEGATIVE 08/29/2017 0222   HGBUR NEGATIVE 08/29/2017 0222   BILIRUBINUR NEGATIVE 08/29/2017 0222   KETONESUR NEGATIVE 08/29/2017 0222   PROTEINUR 100 (A) 08/29/2017 0222   UROBILINOGEN 0.2 03/22/2014 0040   NITRITE NEGATIVE 08/29/2017 0222   LEUKOCYTESUR NEGATIVE 08/29/2017 0222     Terrel Nesheiwat M.D. [facility] 08/31/2017, 10:16 AM  Pager: [phone] Between 7am to 7pm - call Pager - [phone]  After 7pm go to [email or web address] - password [email or web address]  Call night coverage person covering after 7pm

## 2017-08-31 NOTE — Progress Notes (Signed)
Patient's wife called to check on him-Message delivered to patient. Patient is A&O X4, walked to the bathroom with RN present. No further request at this time. Will continue to monitor

## 2017-08-31 NOTE — Progress Notes (Signed)
CM met with the patient and he states he had insurance up until 08/29/2017 through his wife. She no longer is willing to cover him under her insurance per pt. CM provided him resources for the [facility] and for some of the low cost clinics in [place]. CM will follow for assistance with medications at d/c.

## 2017-08-31 NOTE — Progress Notes (Signed)
OT Screen    08/31/17 1400  OT Visit Information  Last OT Received On 08/31/17  Reason Eval/Treat Not Completed OT screened, no needs identified, will sign off. Per PT and RN, pt is functioning near baseline function. Please re-order OT evaluation if status changes. Thank you.    Frederico Gerling MSOT, OTR/L Acute Rehab Pager: [phone] Office: [phone]

## 2017-08-31 NOTE — Progress Notes (Signed)
D/c reviewed with patient. He states that his "ride" would not get here till "7.30 or 8pm". IV removed. Patient ate dinner. He is in the room at this time-No further questions for now

## 2017-08-31 NOTE — Consult Note (Signed)
[facility] Consult   Reason for Consult: Polysubstance intoxication and obtundation on arrival Referring Physician:  Dr. Tana Coast Patient Identification: Nikoli Nasser MRN:  [id number] Principal Diagnosis: Acute respiratory failure [facility]) Diagnosis:   Patient Active Problem List   Diagnosis Date Noted  . Intentional drug overdose ([place]) [T50.902A]   . Acute respiratory failure ([place]) [J96.00] 08/29/2017  . Acute encephalopathy [G93.40] 08/29/2017    Total Time spent with patient: 1 hour  Subjective:   Traves Majchrzak is a 36 y.o. male patient admitted with polysubstance intoxication.  HPI:  Tery Hoeger is a 36 years old married male admitted to call [facility] from [place] emergency department for intentional drug overdose of multiple illicit drugs and then find him up tended in his friend's home. Patient reported he took a large short of heroin while in bath room which resulted being called EMS but reportedly he has binge of drug use for the whole day. Patient also endorses abusing multiple drugs after he had a disagreement or argument with his wife 4 years. Patient reportedly abusing drugs since he was 36 years old, reportedly received 30 day substance abuse rehabilitation 6. His last rehabilitation was 6 years ago in [place]. Reportedly he was also previously participated in [place], [place]. Patient reported he was involved in probation secondary to drug related charges. Patient probation officer from [place] spoke with his wife regarding his current probation charges and possible referral to the substance abuse treatment programs by [employer]. Patient reportedly was sober about 1 year after he was married 4 years ago. Reportedly patient wife is also involved with some kind of drug or substance. Patient is trying to minimize his drug abuse until today, saying that is not a problem and intentional overdose saying that he made a wrong decision. Patient  repeatedly stated that he has no intention to end his life or homicidal ideation or psychotic symptoms. Patient Urine drug screen positive for opiates, cocaine, benzos, amphetamines, andTHC.   Past Psychiatric History:  Patient has no previous history of acute psychiatric hospitalization or detox treatment.   Risk to Self: Is patient at risk for suicide?: No Risk to Others:   Prior Inpatient Therapy:   Prior Outpatient Therapy:    Past Medical History:  Past Medical History:  Diagnosis Date  . Hepatitis C     Past Surgical History:  Procedure Laterality Date  . ORTHOPEDIC SURGERY     Family History: History reviewed. No pertinent family history. Family Psychiatric  History: patient has older brother who has been incarcerated for 10 years for drug charges Social History:  History  Alcohol Use No     History  Drug Use  . Types: Marijuana    Social History   Social History  . Marital status: Married    Spouse name: N/A  . Number of children: N/A  . Years of education: N/A   Social History Main Topics  . Smoking status: Current Every Day Smoker  . Smokeless tobacco: Never Used  . Alcohol use No  . Drug use: Yes    Types: Marijuana  . Sexual activity: Yes   Other Topics Concern  . None   Social History Narrative  . None   Additional Social History:patient has a history of incarceration related to drug charges in the past and currently on probation from [facility].    Allergies:  No Known Allergies  Labs:  Results for orders placed or performed during the hospital encounter of  08/29/17 (from the past 48 hour(s))  Glucose, capillary     Status: Abnormal   Collection Time: 08/29/17  4:14 PM  Result Value Ref Range   Glucose-Capillary 50 (L) 65 - 99 mg/dL   Comment 1 Capillary Specimen    Comment 2 Notify RN   Glucose, capillary     Status: None   Collection Time: 08/29/17  5:18 PM  Result Value Ref Range   Glucose-Capillary 94 65 - 99 mg/dL    Comment 1 Capillary Specimen   Glucose, capillary     Status: Abnormal   Collection Time: 08/29/17  8:24 PM  Result Value Ref Range   Glucose-Capillary 154 (H) 65 - 99 mg/dL   Comment 1 Notify RN   Glucose, capillary     Status: Abnormal   Collection Time: 08/30/17 12:29 AM  Result Value Ref Range   Glucose-Capillary 106 (H) 65 - 99 mg/dL   Comment 1 Notify RN   Basic metabolic panel     Status: Abnormal   Collection Time: 08/30/17  2:27 AM  Result Value Ref Range   Sodium 137 135 - 145 mmol/L   Potassium 3.7 3.5 - 5.1 mmol/L   Chloride 108 101 - 111 mmol/L   CO2 23 22 - 32 mmol/L   Glucose, Bld 117 (H) 65 - 99 mg/dL   BUN 10 6 - 20 mg/dL   Creatinine, Ser 0.89 0.61 - 1.24 mg/dL   Calcium 8.0 (L) 8.9 - 10.3 mg/dL   GFR calc non Af Amer >60 >60 mL/min   GFR calc Af Amer >60 >60 mL/min    Comment: (NOTE) The eGFR has been calculated using the CKD EPI equation. This calculation has not been validated in all clinical situations. eGFR's persistently <60 mL/min signify possible Chronic Kidney Disease.    Anion gap 6 5 - 15  Magnesium     Status: None   Collection Time: 08/30/17  2:27 AM  Result Value Ref Range   Magnesium 2.0 1.7 - 2.4 mg/dL  Glucose, capillary     Status: Abnormal   Collection Time: 08/30/17  7:16 AM  Result Value Ref Range   Glucose-Capillary 146 (H) 65 - 99 mg/dL   Comment 1 Capillary Specimen    Comment 2 Notify RN   Glucose, capillary     Status: Abnormal   Collection Time: 08/30/17 11:14 AM  Result Value Ref Range   Glucose-Capillary 125 (H) 65 - 99 mg/dL   Comment 1 Capillary Specimen    Comment 2 Notify RN   Basic metabolic panel     Status: Abnormal   Collection Time: 08/31/17  5:03 AM  Result Value Ref Range   Sodium 139 135 - 145 mmol/L   Potassium 3.9 3.5 - 5.1 mmol/L   Chloride 107 101 - 111 mmol/L   CO2 26 22 - 32 mmol/L   Glucose, Bld 101 (H) 65 - 99 mg/dL   BUN 6 6 - 20 mg/dL   Creatinine, Ser 0.82 0.61 - 1.24 mg/dL   Calcium 8.5 (L)  8.9 - 10.3 mg/dL   GFR calc non Af Amer >60 >60 mL/min   GFR calc Af Amer >60 >60 mL/min    Comment: (NOTE) The eGFR has been calculated using the CKD EPI equation. This calculation has not been validated in all clinical situations. eGFR's persistently <60 mL/min signify possible Chronic Kidney Disease.    Anion gap 6 5 - 15  CBC     Status: None   Collection  Time: 08/31/17  5:03 AM  Result Value Ref Range   WBC 8.5 4.0 - 10.5 K/uL   RBC 4.44 4.22 - 5.81 MIL/uL   Hemoglobin 13.6 13.0 - 17.0 g/dL   HCT 39.4 39.0 - 52.0 %   MCV 88.7 78.0 - 100.0 fL   MCH 30.6 26.0 - 34.0 pg   MCHC 34.5 30.0 - 36.0 g/dL   RDW 13.2 11.5 - 15.5 %   Platelets 176 150 - 400 K/uL  Magnesium     Status: None   Collection Time: 08/31/17  5:03 AM  Result Value Ref Range   Magnesium 1.9 1.7 - 2.4 mg/dL  Phosphorus     Status: None   Collection Time: 08/31/17  5:03 AM  Result Value Ref Range   Phosphorus 2.6 2.5 - 4.6 mg/dL  Hepatic function panel     Status: Abnormal   Collection Time: 08/31/17  5:03 AM  Result Value Ref Range   Total Protein 5.5 (L) 6.5 - 8.1 g/dL   Albumin 3.1 (L) 3.5 - 5.0 g/dL   AST 38 15 - 41 U/L   ALT 56 17 - 63 U/L   Alkaline Phosphatase 73 38 - 126 U/L   Total Bilirubin 0.6 0.3 - 1.2 mg/dL   Bilirubin, Direct <0.1 (L) 0.1 - 0.5 mg/dL   Indirect Bilirubin NOT CALCULATED 0.3 - 0.9 mg/dL    Current Facility-Administered Medications  Medication Dose Route Frequency Provider Last Rate Last Dose  . acetaminophen (TYLENOL) tablet 650 mg  650 mg Oral Q6H PRN Deterding, Guadelupe Sabin, MD   650 mg at 08/30/17 0334  . Chlorhexidine Gluconate Cloth 2 % PADS 6 each  6 each Topical Q0600 Collene Gobble, MD   6 each at 08/30/17 0346  . enoxaparin (LOVENOX) injection 40 mg  40 mg Subcutaneous Q24H Javier Glazier, MD   40 mg at 08/29/17 1008  . mupirocin ointment (BACTROBAN) 2 % 1 application  1 application Nasal BID Collene Gobble, MD   1 application at 83/43/73 1104  . pantoprazole  (PROTONIX) injection 40 mg  40 mg Intravenous QHS Javier Glazier, MD   40 mg at 08/30/17 2144    Musculoskeletal: Strength & Muscle Tone: decreased Gait & Station: unable to stand Patient leans: N/A  Psychiatric Specialty Exam: Physical Exam as per history and physical  ROS Patient denied withdrawal symptoms of opioids or benzodiazepines during this evaluation. No Fever-chills, No Headache, No changes with Vision or hearing, reports vertigo No problems swallowing food or Liquids, No Chest pain, Cough or Shortness of Breath, No Abdominal pain, No Nausea or Vommitting, Bowel movements are regular, No Blood in stool or Urine, No dysuria, No new skin rashes or bruises, No new joints pains-aches,  No new weakness, tingling, numbness in any extremity, No recent weight gain or loss, No polyuria, polydypsia or polyphagia,   A full 10 point Review of Systems was done, except as stated above, all other Review of Systems were negative.  Blood pressure 119/77, pulse 86, temperature 97.8 F (36.6 C), temperature source Oral, resp. rate 20, height 5' 8"  (1.727 m), weight 73.2 kg (161 lb 6.4 oz), SpO2 100 %.Body mass index is 24.54 kg/m.  General Appearance: Guarded  Eye Contact:  Good  Speech:  Clear and Coherent and Slow  Volume:  Decreased  Mood:  Depressed and Hopeless  Affect:  Congruent and Depressed  Thought Process:  Coherent and Goal Directed  Orientation:  Full (Time, Place, and Person)  Thought Content:  Rumination  Suicidal Thoughts:  No  Homicidal Thoughts:  No  Memory:  Immediate;   Good Recent;   Fair Remote;   Fair  Judgement:  Impaired  Insight:  Fair  Psychomotor Activity:  Decreased  Concentration:  Concentration: Fair and Attention Span: Fair  Recall:  [employer] of Knowledge:  Good  Language:  Good  Akathisia:  Negative  Handed:  Right  AIMS (if indicated):     Assets:  Communication Skills Housing Leisure Time Resilience Social  Support Talents/Skills Transportation Vocational/Educational  ADL's:  Intact  Cognition:  WNL  Sleep:        Treatment Plan Summary: 36 years old male presented with the status post intentional drug overdose mostly illicit drugs and reportedly accidentally overdosed heroin secondary to conflict with his wife. Patient has a long history of substance abuse and previous rehabilitation treatments and able to stay sober one year in the past.  Patient is reluctant to commit for the residential substance abuse treatment at this time  Patient probation officer has been talking with the patient wife regarding substance abuse counseling referrals and the local community.  Patient does not meet criteria for safety or inpatient psychiatric hospitalization  Patient is psychiatrically cleared for the resistance of substance abuse treatment program or intensive outpatient program  Referred to the LCSW for providing referral information regarding rest and substance abuse programs in [facility] and if possible contact his probation officer for coordination of care.  Monitor for the possible withdrawal symptoms off opiates and benzodiazepines, patient has no withdrawal symptoms during this evaluation.  Appreciate psychiatric consultation and we sign off as of today Please contact [phone] or [phone] if needs further assistance    Disposition: No evidence of imminent risk to self or others at present.   Patient does not meet criteria for psychiatric inpatient admission. Supportive therapy provided about ongoing stressors.  Ambrose Finland, MD 08/31/2017 11:56 AM

## 2018-08-03 IMAGING — CT CT HEAD W/O CM
3 of 7 series · 11 of 47 positions shown, 13 images · non-contrast
Comparison: None.

CLINICAL DATA: Altered level of consciousness.

EXAM:
CT HEAD WITHOUT CONTRAST
TECHNIQUE: Contiguous axial images were obtained from the base of the skull
through the vertex without intravenous contrast.

[Series 2: head trauma wo · axial · 0.45mm/px · z∈[+323,+428]mm · 6 of 31 slices shown, 8 images]
[im 5/31  brain]
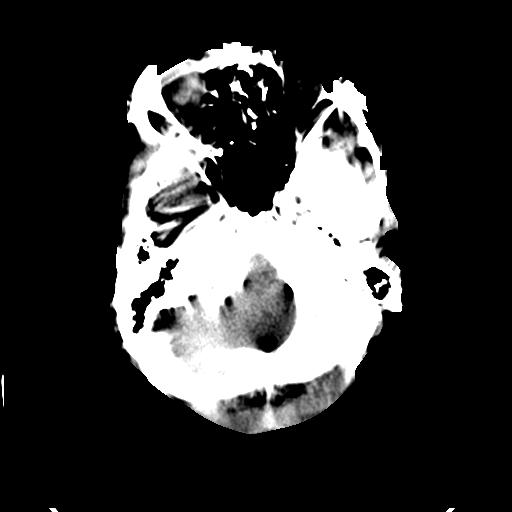
[im 5/31  bone]
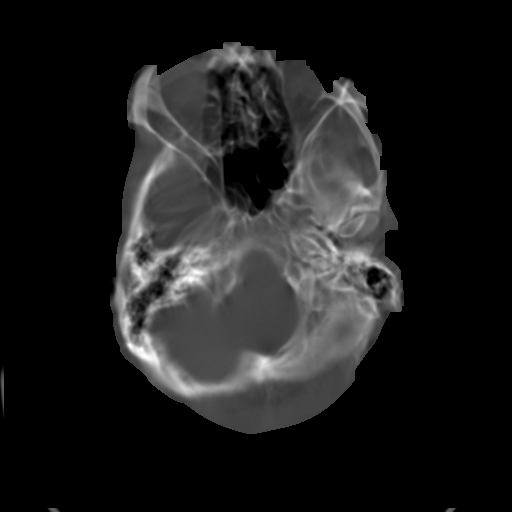
[im 9/31  brain]
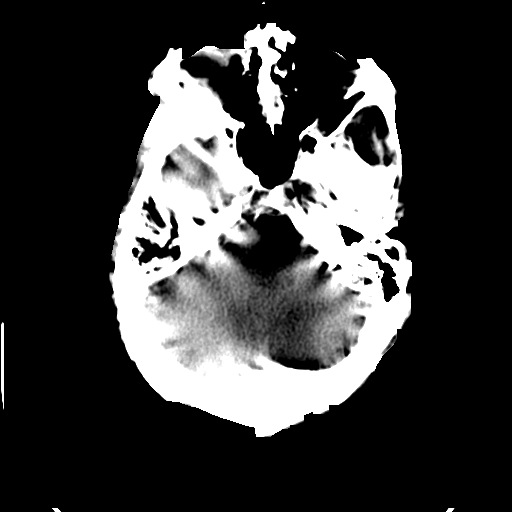
[im 13/31  brain]
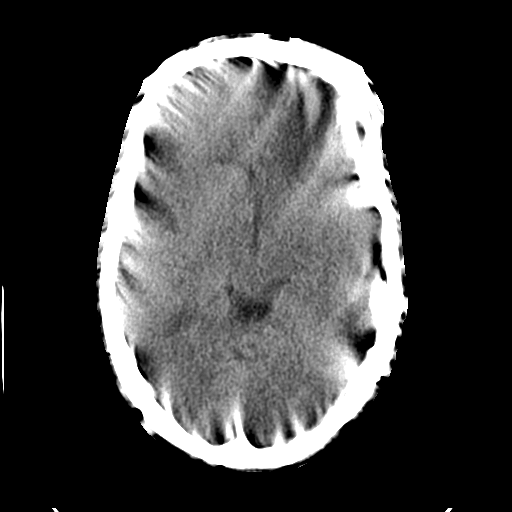
[im 18/31  brain]
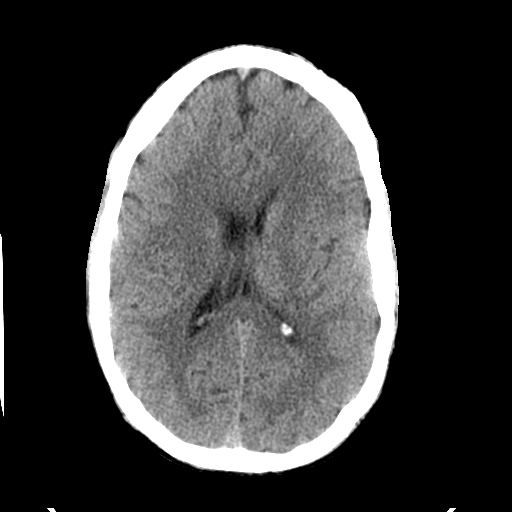
[im 22/31  brain]
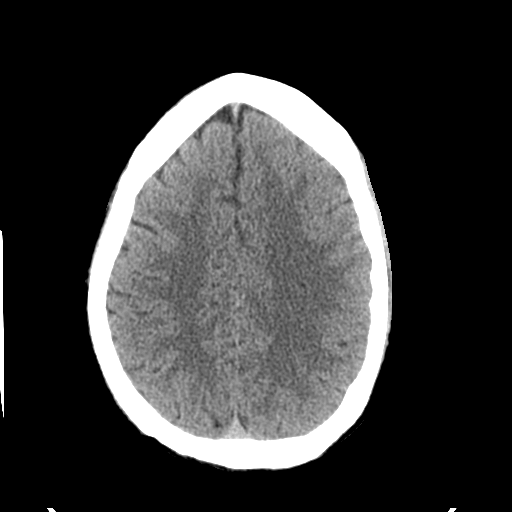
[im 22/31  bone]
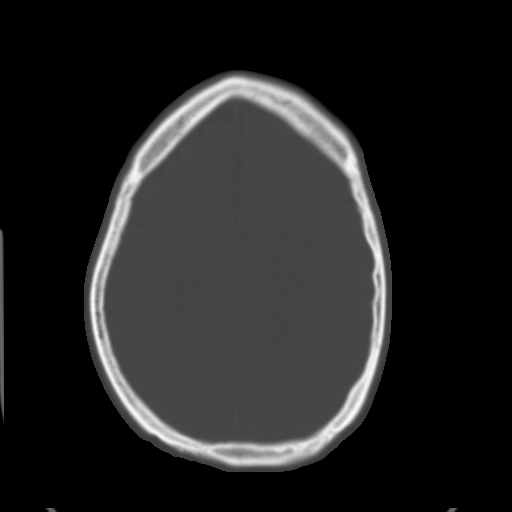
[im 26/31  brain]
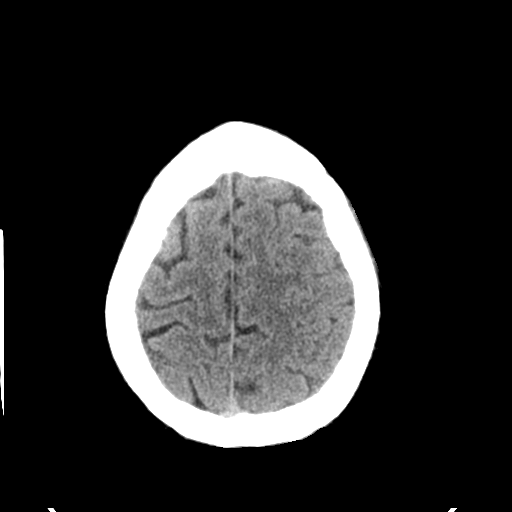

[Series 8: coronal soft tissue · coronal · 0.18mm/px · 3 of 84 slices shown]
[im 44/84  brain]
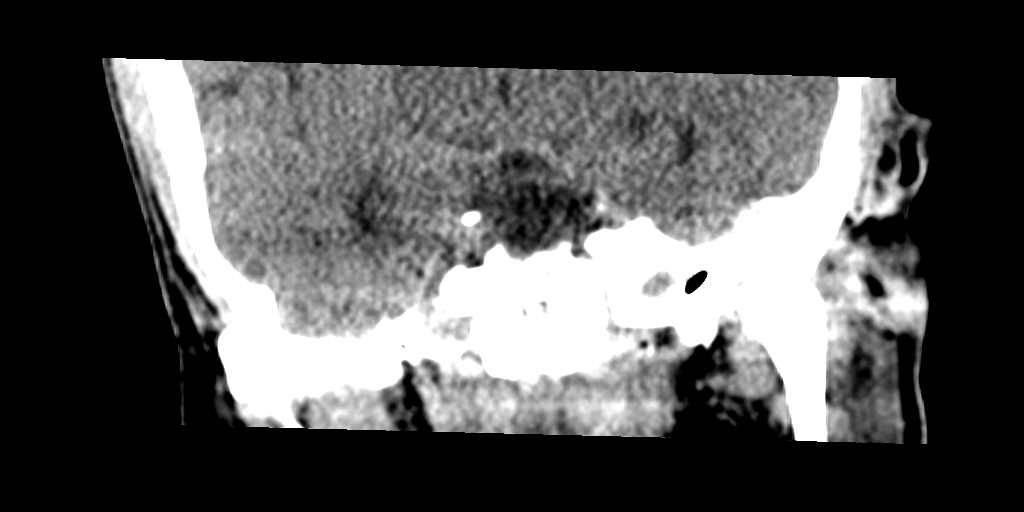
[im 52/84  brain]
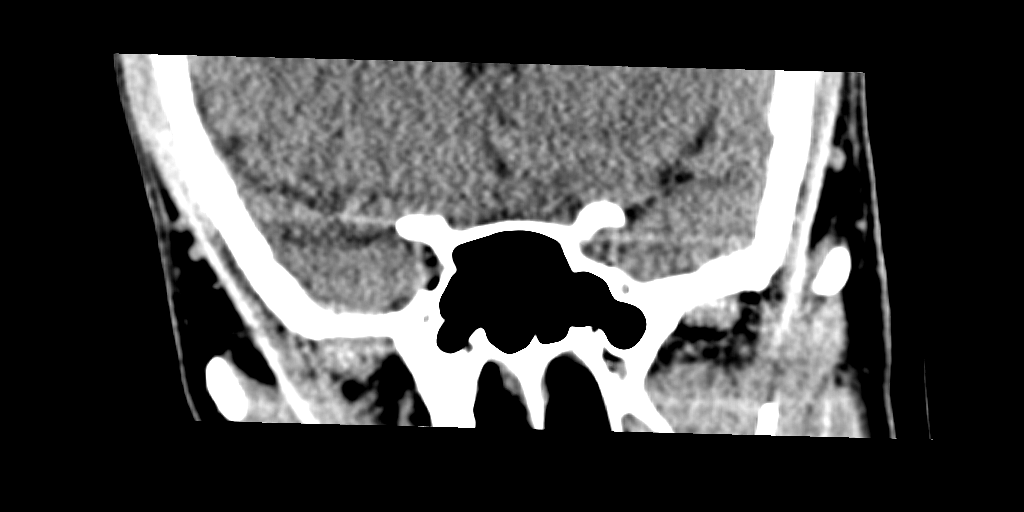
[im 60/84  brain]
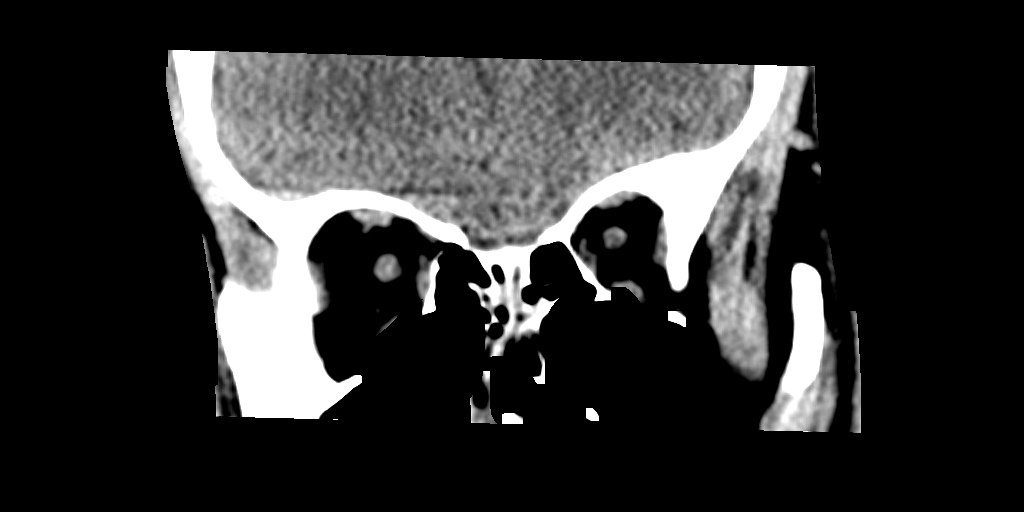

[Series 9: sagittal soft tissue · sagittal · 0.17mm/px · 2 of 61 slices shown]
[im 21/61  brain]
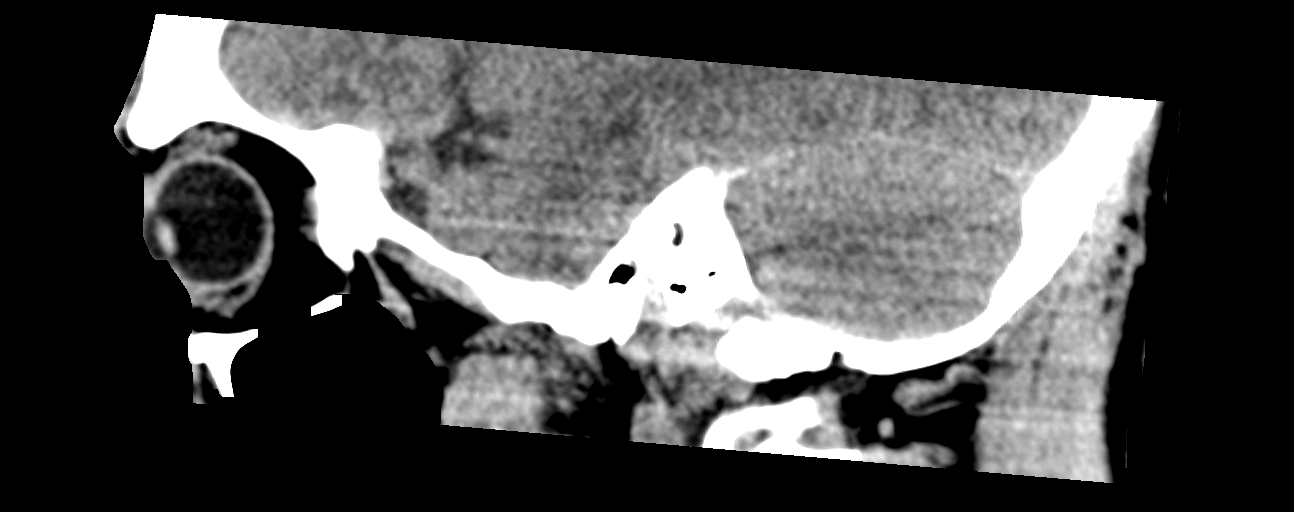
[im 41/61  brain]
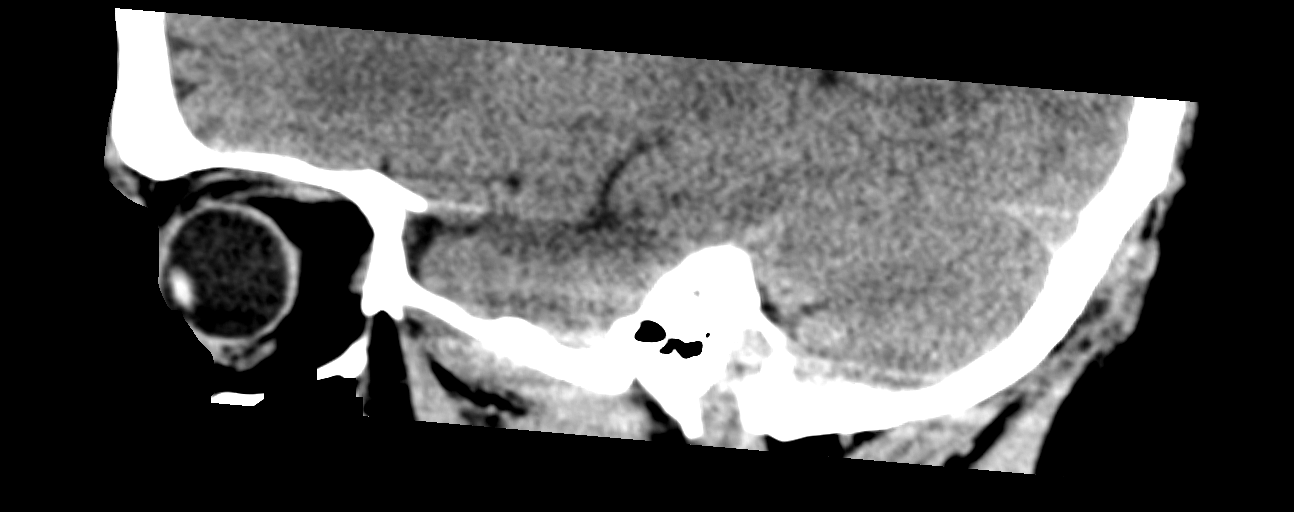

[11 of 47 positions shown; findings below may reference images not displayed]

FINDINGS: Brain: Despite repeat imaging there is moderate motion artifact.
Allowing for this no evidence of acute hemorrhage or ischemia. No
hydrocephalus, the basilar cisterns are patent. No subdural or
extra-axial fluid collection.

Vascular: No hyperdense vessel.

Skull: Negative for fracture allowing for motion. Vertex not
included in the field of view.

Sinuses/Orbits: Scattered ethmoid mucosal thickening. No fluid
levels. The mastoid air cells are clear. No acute orbital
abnormality.

Other: None.
IMPRESSION: No acute intracranial abnormality, allowing for motion artifact.
